# Patient Record
Sex: Female | Born: 1995 | Race: White | Hispanic: No | Marital: Single | Smoking: Never smoker
Health system: Southern US, Community
[De-identification: ages and names within clinical notes are randomized; demographics above are authoritative.]

## PROBLEM LIST (undated history)

## (undated) DIAGNOSIS — N200 Calculus of kidney: Secondary | ICD-10-CM

## (undated) DIAGNOSIS — E039 Hypothyroidism, unspecified: Secondary | ICD-10-CM

## (undated) HISTORY — PX: WISDOM TOOTH EXTRACTION: SHX21

## (undated) HISTORY — PX: MYRINGOTOMY WITH TUBE PLACEMENT: SHX5663

---

## 2014-06-03 ENCOUNTER — Encounter (HOSPITAL_COMMUNITY): Payer: Self-pay | Admitting: Emergency Medicine

## 2014-06-03 ENCOUNTER — Emergency Department (HOSPITAL_COMMUNITY): Payer: BC Managed Care – PPO

## 2014-06-03 ENCOUNTER — Emergency Department (HOSPITAL_COMMUNITY)
Admission: EM | Admit: 2014-06-03 | Discharge: 2014-06-04 | Disposition: A | Discharge status: Home / Self Care | Payer: BC Managed Care – PPO | Attending: Emergency Medicine | Admitting: Emergency Medicine

## 2014-06-03 DIAGNOSIS — R630 Anorexia: Secondary | ICD-10-CM | POA: Insufficient documentation

## 2014-06-03 DIAGNOSIS — R1031 Right lower quadrant pain: Secondary | ICD-10-CM | POA: Insufficient documentation

## 2014-06-03 DIAGNOSIS — Z3202 Encounter for pregnancy test, result negative: Secondary | ICD-10-CM | POA: Insufficient documentation

## 2014-06-03 DIAGNOSIS — R319 Hematuria, unspecified: Secondary | ICD-10-CM | POA: Insufficient documentation

## 2014-06-03 LAB — URINALYSIS, ROUTINE W REFLEX MICROSCOPIC
GLUCOSE, UA: NEGATIVE mg/dL
Hgb urine dipstick: NEGATIVE
Ketones, ur: 15 mg/dL — AB
LEUKOCYTES UA: NEGATIVE
NITRITE: NEGATIVE
PROTEIN: 30 mg/dL — AB
Specific Gravity, Urine: 1.029 (ref 1.005–1.030)
Urobilinogen, UA: 0.2 mg/dL (ref 0.0–1.0)
pH: 5 (ref 5.0–8.0)

## 2014-06-03 LAB — POC URINE PREG, ED: Preg Test, Ur: NEGATIVE

## 2014-06-03 LAB — COMPREHENSIVE METABOLIC PANEL
ALT: 13 U/L (ref 0–35)
AST: 15 U/L (ref 0–37)
Albumin: 3.9 g/dL (ref 3.5–5.2)
Alkaline Phosphatase: 46 U/L (ref 39–117)
Anion gap: 15 (ref 5–15)
BUN: 15 mg/dL (ref 6–23)
CALCIUM: 9.8 mg/dL (ref 8.4–10.5)
CO2: 22 mEq/L (ref 19–32)
Chloride: 98 mEq/L (ref 96–112)
Creatinine, Ser: 0.75 mg/dL (ref 0.50–1.10)
GFR calc Af Amer: >90 mL/min (ref >90)
GFR calc non Af Amer: >90 mL/min (ref >90)
GLUCOSE: 80 mg/dL (ref 70–99)
Potassium: 4.2 mEq/L (ref 3.7–5.3)
SODIUM: 135 meq/L — AB (ref 137–147)
Total Bilirubin: 0.9 mg/dL (ref 0.3–1.2)
Total Protein: 7.3 g/dL (ref 6.0–8.3)

## 2014-06-03 LAB — CBC WITH DIFFERENTIAL/PLATELET
BASOS ABS: 0 10*3/uL (ref 0.0–0.1)
Basophils Relative: 0 % (ref 0–1)
EOS PCT: 1 % (ref 0–5)
Eosinophils Absolute: 0 10*3/uL (ref 0.0–0.7)
HCT: 36.5 % (ref 36.0–46.0)
Hemoglobin: 12.6 g/dL (ref 12.0–15.0)
Lymphocytes Relative: 32 % (ref 12–46)
Lymphs Abs: 2.4 10*3/uL (ref 0.7–4.0)
MCH: 28.8 pg (ref 26.0–34.0)
MCHC: 34.5 g/dL (ref 30.0–36.0)
MCV: 83.3 fL (ref 78.0–100.0)
Monocytes Absolute: 0.4 10*3/uL (ref 0.1–1.0)
Monocytes Relative: 6 % (ref 3–12)
Neutro Abs: 4.5 10*3/uL (ref 1.7–7.7)
Neutrophils Relative %: 61 % (ref 43–77)
PLATELETS: 230 10*3/uL (ref 150–400)
RBC: 4.38 MIL/uL (ref 3.87–5.11)
RDW: 12.1 % (ref 11.5–15.5)
WBC: 7.4 10*3/uL (ref 4.0–10.5)

## 2014-06-03 LAB — WET PREP, GENITAL
TRICH WET PREP: NONE SEEN
YEAST WET PREP: NONE SEEN

## 2014-06-03 LAB — URINE MICROSCOPIC-ADD ON

## 2014-06-03 MED ORDER — IOHEXOL 300 MG/ML  SOLN
50.0000 mL | Freq: Once | INTRAMUSCULAR | Status: AC | PRN
  Administered 2014-06-03: 50 mL via ORAL

## 2014-06-03 MED ORDER — SODIUM CHLORIDE 0.9 % IV SOLN: 1000.0000 mL | INTRAVENOUS | Status: DC

## 2014-06-03 MED ORDER — SODIUM CHLORIDE 0.9 % IV SOLN
1000.0000 mL | Freq: Once | INTRAVENOUS | Status: AC
  Administered 2014-06-03: 1000 mL via INTRAVENOUS

## 2014-06-03 MED ORDER — MORPHINE SULFATE 4 MG/ML IJ SOLN
4.0000 mg | Freq: Once | INTRAMUSCULAR | Status: AC
  Administered 2014-06-03: 4 mg via INTRAVENOUS
  Filled 2014-06-03: qty 1

## 2014-06-03 MED ORDER — ONDANSETRON HCL 4 MG/2ML IJ SOLN
4.0000 mg | Freq: Once | INTRAMUSCULAR | Status: AC
  Administered 2014-06-03: 4 mg via INTRAVENOUS
  Filled 2014-06-03: qty 2

## 2014-06-03 MED ORDER — IOHEXOL 300 MG/ML  SOLN
100.0000 mL | Freq: Once | INTRAMUSCULAR | Status: AC | PRN
  Administered 2014-06-03: 100 mL via INTRAVENOUS

## 2014-06-03 NOTE — ED Notes (Addendum)
Pt c/o RLQ abd pain onset last night, decreased appetite, denies n/v/d. + rebound tenderness RLQ, worse with standing

## 2014-06-03 NOTE — ED Notes (Signed)
Pt reports RLQ pain and lightheadedness since last night with decreased appetite.  Pt reports pain initially was tolerable, gotten worse today while at work.   Pt reports she has not passed any gas.

## 2014-06-03 NOTE — ED Notes (Signed)
Pt drinking PO contrast at present.  Family at bedside

## 2014-06-03 NOTE — ED Provider Notes (Signed)
CSN: [id number]     Arrival date & time 06/03/14  1919 History   First MD Initiated Contact with Patient 06/03/14 2012     Chief Complaint  Patient presents with  . Abdominal Pain     (Consider location/radiation/quality/duration/timing/severity/associated sxs/prior Treatment) HPI Comments: Natalie Kramer is a(n) 18 y.o. female who presents w/ cc of RLQ pain. She states that she has had intermittent sharp pain in the RLQ for many months, however it woke her from sleep at 1:00 AM and has been progressively worsening since that time. It is constant, sharp, non-radiating. Denies fevers, chills, myalgias, arthralgias. Denies DOE, SOB, chest tightness or pressure, radiation to left arm, jaw or back, or diaphoresis. Denies dysuria, flank pain, suprapubic pain, frequency, urgency, or hematuria. Denies headaches, light headedness, weakness, visual disturbances. Deniesnausea, vomiting, diarrhea or constipation. Denies vaginal sxs. LMP 05/02/2014    Patient is a 18 y.o. female presenting with abdominal pain. The history is provided by the patient and a relative. No language interpreter was used.  Abdominal Pain Pain location:  RLQ Pain quality: aching, sharp and stabbing   Pain radiates to:  Does not radiate Pain severity:  Severe Onset quality:  Gradual Duration:  10 hours Timing:  Constant Progression:  Worsening Chronicity:  Recurrent Context: awakening from sleep   Context: not alcohol use, not diet changes, not eating, not laxative use, not medication withdrawal, not previous surgeries, not recent illness, not recent sexual activity, not recent travel, not retching, not sick contacts, not suspicious food intake and not trauma   Relieved by:  Nothing Worsened by:  Palpation, movement and position changes Ineffective treatments:  None tried Associated symptoms: anorexia and hematuria   Associated symptoms: no belching, no chest pain, no chills, no constipation, no cough, no diarrhea, no  dysuria, no fatigue, no fever, no flatus, no hematemesis, no hematochezia, no melena, no nausea, no shortness of breath, no sore throat, no vaginal bleeding, no vaginal discharge and no vomiting      History reviewed. No pertinent past medical history. Past Surgical History  Procedure Laterality Date  . Myringotomy with tube placement     No family history on file. History  Substance Use Topics  . Smoking status: Never Smoker   . Smokeless tobacco: Not on file  . Alcohol Use: No   OB History   Grav Para Term Preterm Abortions TAB SAB Ect Mult Living                 Review of Systems  Constitutional: Negative for fever, chills and fatigue.  HENT: Negative for sore throat.   Respiratory: Negative for cough and shortness of breath.   Cardiovascular: Negative for chest pain.  Gastrointestinal: Positive for abdominal pain and anorexia. Negative for nausea, vomiting, diarrhea, constipation, melena, hematochezia, flatus and hematemesis.  Genitourinary: Positive for hematuria. Negative for dysuria, vaginal bleeding and vaginal discharge.      Allergies  Review of patient's allergies indicates no known allergies.  Home Medications   Prior to Admission medications   Not on File   BP 127/73  Pulse 114  Temp(Src) 98.4 F (36.9 C) (Oral)  Resp 20  Ht 5\' 8"  (1.727 m)  Wt 157 lb (71.215 kg)  BMI 23.88 kg/m2  SpO2 100%  LMP 05/04/2014 Physical Exam  Nursing note and vitals reviewed. Constitutional: She is oriented to person, place, and time. She appears well-developed and well-nourished. No distress.  Flushed. Appears uncomfortable  HENT:  Head: Normocephalic and atraumatic.  Eyes: Conjunctivae  are normal. No scleral icterus.  Neck: Normal range of motion.  Cardiovascular: Normal rate, regular rhythm and normal heart sounds.  Exam reveals no gallop and no friction rub.   No murmur heard. Pulmonary/Chest: Effort normal and breath sounds normal. No respiratory distress.    Abdominal: Soft. Normal appearance and bowel sounds are normal. She exhibits no distension and no mass. There is tenderness in the right lower quadrant. There is rebound. There is no guarding and no CVA tenderness.    Neurological: She is alert and oriented to person, place, and time.  Skin: Skin is warm and dry. She is not diaphoretic.    ED Course  Procedures (including critical care time) Labs Review Labs Reviewed  WET PREP, GENITAL - Abnormal; Notable for the following:    Clue Cells Wet Prep HPF POC FEW (*)    WBC, Wet Prep HPF POC FEW (*)    All other components within normal limits  COMPREHENSIVE METABOLIC PANEL - Abnormal; Notable for the following:    Sodium 135 (*)    All other components within normal limits  URINALYSIS, ROUTINE W REFLEX MICROSCOPIC - Abnormal; Notable for the following:    Color, Urine AMBER (*)    APPearance CLOUDY (*)    Bilirubin Urine SMALL (*)    Ketones, ur 15 (*)    Protein, ur 30 (*)    All other components within normal limits  URINE MICROSCOPIC-ADD ON - Abnormal; Notable for the following:    Bacteria, UA FEW (*)    All other components within normal limits  GC/CHLAMYDIA PROBE AMP  CBC WITH DIFFERENTIAL  POC URINE PREG, ED    Imaging Review No results found.   EKG Interpretation None      MDM   Final diagnoses:  Abdominal pain, RLQ (right lower quadrant)   Patient with RLQ abdominal pain, tachycardic, +rebound, obturator, and psoas sign.  Will obtain a CT to r/o appenidcitis. Pain medication and antiemetics given.    Patient labs without acute abnormality.  Pelvic exam: normal external genitalia, vulva, vagina, cervix, uterus and adnexa No CMT, cervical discharge or adnexal tenderness, however patient stlll has RLQ abdominal pain. Will proceed to pelvic US to r/o ovarian torsion.  Patient with negative pelvic US no sign of torsion.  Wet prep with few clue cells, however the patient has no vaginal complaints.  I have  discussed all labs and imaging findings with the patient and her parents.  There does not appear to be an emergent cause of the patent's pain.  I will have the patient follow up with a pcp and OB gyn.   Patient is nontoxic, nonseptic appearing, in no apparent distress.  Patient's pain and other symptoms adequately managed in emergency department.  Fluid bolus given.  Labs, imaging and vitals reviewed.  Patient does not meet the SIRS or Sepsis criteria.  On repeat exam patient does not have a surgical abdomin and there are nor peritoneal signs.  No indication of appendicitis, bowel obstruction, bowel perforation, cholecystitis, diverticulitis, PID or ectopic pregnancy.  Patient discharged home with symptomatic treatment and given strict instructions for follow-up with their primary care physician.  I have also discussed reasons to return immediately to the ER.  Patient expresses understanding and agrees with plan.       Arthor Captain, PA-C 06/06/14 2050

## 2014-06-03 NOTE — ED Provider Notes (Signed)
Complains of right lower quadrant pain onset a few months ago however he came to be worse approximately 1 AM today. Pain has been constant nonradiating admits to diminished appetite no other associated symptoms. No treatment prior to coming here.. Patient feels much improved since treatment in the emergency department on exam no distress abdomen nondistended tender right lower quadrant no guarding no rigidity no rebound  Doug SouSam Haylen Bellotti, MD 06/03/14 2315

## 2014-06-04 MED ORDER — NAPROXEN 500 MG PO TABS: 500.0000 mg | ORAL_TABLET | Freq: Two times a day (BID) | ORAL | Status: DC

## 2014-06-04 NOTE — ED Notes (Signed)
Patient transported to Ultrasound.  US at bedside 

## 2014-06-04 NOTE — Discharge Instructions (Signed)
Abdominal (belly) pain can be caused by many things. Your caregiver performed an examination and possibly ordered blood/urine tests and imaging (CT scan, x-rays, ultrasound). Many cases can be observed and treated at home after initial evaluation in the emergency department. Even though you are being discharged home, abdominal pain can be unpredictable. Therefore, you need a repeated exam if your pain does not resolve, returns, or worsens. Most patients with abdominal pain don't have to be admitted to the hospital or have surgery, but serious problems like appendicitis and gallbladder attacks can start out as nonspecific pain. Many abdominal conditions cannot be diagnosed in one visit, so follow-up evaluations are very important. SEEK IMMEDIATE MEDICAL ATTENTION IF: The pain does not go away or becomes severe.  A temperature above 101 develops.  Repeated vomiting occurs (multiple episodes).  The pain becomes localized to portions of the abdomen. The right side could possibly be appendicitis. In an adult, the left lower portion of the abdomen could be colitis or diverticulitis.  Blood is being passed in stools or vomit (bright red or black tarry stools).  Return also if you develop chest pain, difficulty breathing, dizziness or fainting, or become confused, poorly responsive, or inconsolable (young children).   Abdominal Pain, Women Abdominal (stomach, pelvic, or belly) pain can be caused by many things. It is important to tell your doctor:  The location of the pain.  Does it come and go or is it present all the time?  Are there things that start the pain (eating certain foods, exercise)?  Are there other symptoms associated with the pain (fever, nausea, vomiting, diarrhea)? All of this is helpful to know when trying to find the cause of the pain. CAUSES   Stomach: virus or bacteria infection, or ulcer.  Intestine: appendicitis (inflamed appendix), regional ileitis (Crohn's disease),  ulcerative colitis (inflamed colon), irritable bowel syndrome, diverticulitis (inflamed diverticulum of the colon), or cancer of the stomach or intestine.  Gallbladder disease or stones in the gallbladder.  Kidney disease, kidney stones, or infection.  Pancreas infection or cancer.  Fibromyalgia (pain disorder).  Diseases of the female organs:  Uterus: fibroid (non-cancerous) tumors or infection.  Fallopian tubes: infection or tubal pregnancy.  Ovary: cysts or tumors.  Pelvic adhesions (scar tissue).  Endometriosis (uterus lining tissue growing in the pelvis and on the pelvic organs).  Pelvic congestion syndrome (female organs filling up with blood just before the menstrual period).  Pain with the menstrual period.  Pain with ovulation (producing an egg).  Pain with an IUD (intrauterine device, birth control) in the uterus.  Cancer of the female organs.  Functional pain (pain not caused by a disease, may improve without treatment).  Psychological pain.  Depression. DIAGNOSIS  Your doctor will decide the seriousness of your pain by doing an examination.  Blood tests.  X-rays.  Ultrasound.  CT scan (computed tomography, special type of X-ray).  MRI (magnetic resonance imaging).  Cultures, for infection.  Barium enema (dye inserted in the large intestine, to better view it with X-rays).  Colonoscopy (looking in intestine with a lighted tube).  Laparoscopy (minor surgery, looking in abdomen with a lighted tube).  Major abdominal exploratory surgery (looking in abdomen with a large incision). TREATMENT  The treatment will depend on the cause of the pain.   Many cases can be observed and treated at home.  Over-the-counter medicines recommended by your caregiver.  Prescription medicine.  Antibiotics, for infection.  Birth control pills, for painful periods or for ovulation pain.  Hormone treatment, for endometriosis.  Nerve blocking  injections.  Physical therapy.  Antidepressants.  Counseling with a psychologist or psychiatrist.  Minor or major surgery. HOME CARE INSTRUCTIONS   Do not take laxatives, unless directed by your caregiver.  Take over-the-counter pain medicine only if ordered by your caregiver. Do not take aspirin because it can cause an upset stomach or bleeding.  Try a clear liquid diet (broth or water) as ordered by your caregiver. Slowly move to a bland diet, as tolerated, if the pain is related to the stomach or intestine.  Have a thermometer and take your temperature several times a day, and record it.  Bed rest and sleep, if it helps the pain.  Avoid sexual intercourse, if it causes pain.  Avoid stressful situations.  Keep your follow-up appointments and tests, as your caregiver orders.  If the pain does not go away with medicine or surgery, you may try:  Acupuncture.  Relaxation exercises (yoga, meditation).  Group therapy.  Counseling. SEEK MEDICAL CARE IF:   You notice certain foods cause stomach pain.  Your home care treatment is not helping your pain.  You need stronger pain medicine.  You want your IUD removed.  You feel faint or lightheaded.  You develop nausea and vomiting.  You develop a rash.  You are having side effects or an allergy to your medicine. SEEK IMMEDIATE MEDICAL CARE IF:   Your pain does not go away or gets worse.  You have a fever.  Your pain is felt only in portions of the abdomen. The right side could possibly be appendicitis. The left lower portion of the abdomen could be colitis or diverticulitis.  You are passing blood in your stools (bright red or black tarry stools, with or without vomiting).  You have blood in your urine.  You develop chills, with or without a fever.  You pass out. MAKE SURE YOU:   Understand these instructions.  Will watch your condition.  Will get help right away if you are not doing well or get  worse. Document Released: 09/06/2007 Document Revised: 02/01/2012 Document Reviewed: 09/26/2009 [facility] Patient Information 2015 [employer], [place]. This information is not intended to replace advice given to you by your health care provider. Make sure you discuss any questions you have with your health care provider.

## 2014-06-05 LAB — GC/CHLAMYDIA PROBE AMP
CT Probe RNA: NEGATIVE
GC Probe RNA: NEGATIVE

## 2014-06-10 NOTE — ED Provider Notes (Signed)
Medical screening examination/treatment/procedure(s) were conducted as a shared visit with non-physician practitioner(s) and myself.  I personally evaluated the patient during the encounter.   EKG Interpretation None       Doug SouSam Darral Rishel, MD 06/10/14 2055

## 2015-04-21 IMAGING — CT CT ABD-PELV W/ CM
1 of 2 series · 15 of 32 positions shown, 19 images · IV contrast (OMNIPAQUE 300)
Comparison: None.

CLINICAL DATA: Right lower quadrant pain, rebound tenderness.

EXAM:
CT ABDOMEN AND PELVIS WITH CONTRAST
TECHNIQUE: Multidetector CT imaging of the abdomen and pelvis was performed
using the standard protocol following bolus administration of
intravenous contrast.
CONTRAST:  50mL OMNIPAQUE IOHEXOL 300 MG/ML SOLN, 100mL OMNIPAQUE
IOHEXOL 300 MG/ML SOLN

[Series 2: abd/pel with · axial · 0.76mm/px · z∈[+1112,+1572]mm · 15 of 100 slices shown, 19 images]
[im 4/100  soft-tissue]
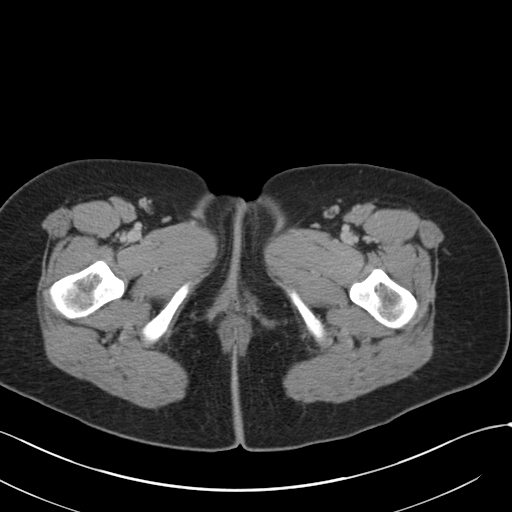
[im 4/100  bone]
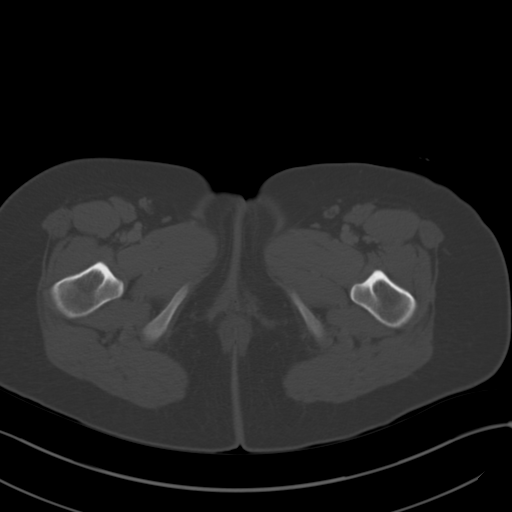
[im 12/100  soft-tissue]
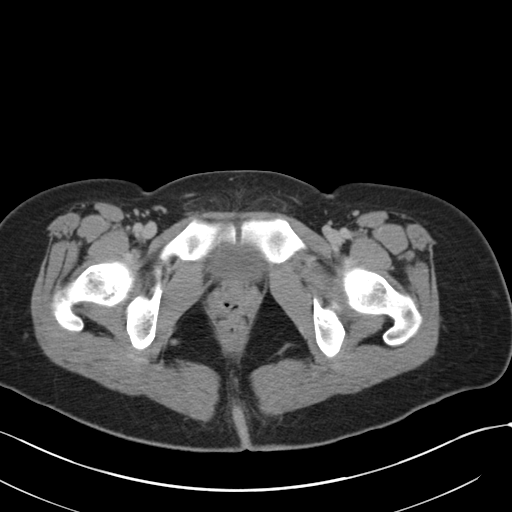
[im 20/100  soft-tissue]
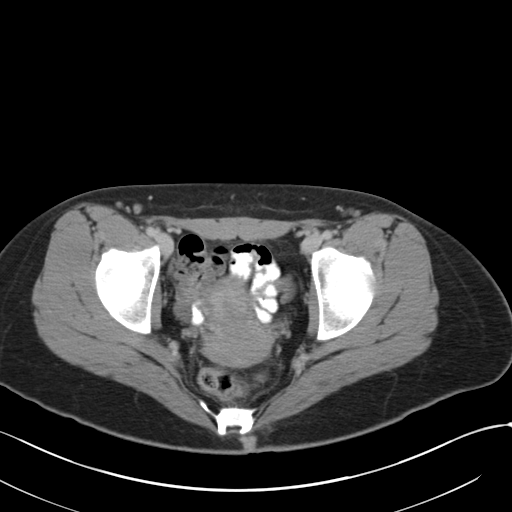
[im 28/100  soft-tissue]
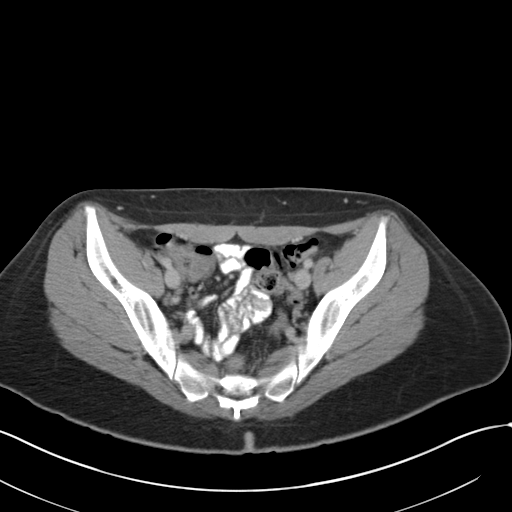
[im 36/100  soft-tissue]
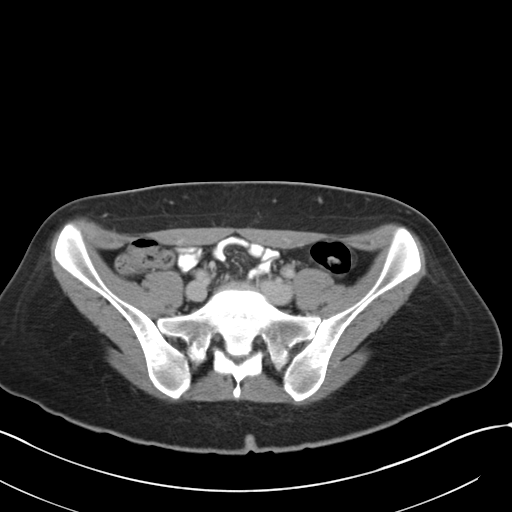
[im 44/100  soft-tissue]
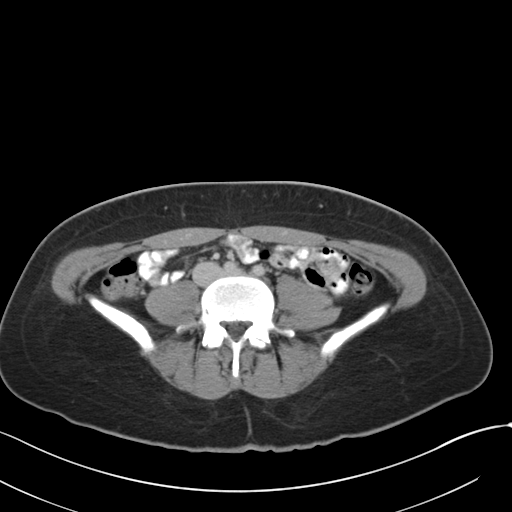
[im 52/100  soft-tissue]
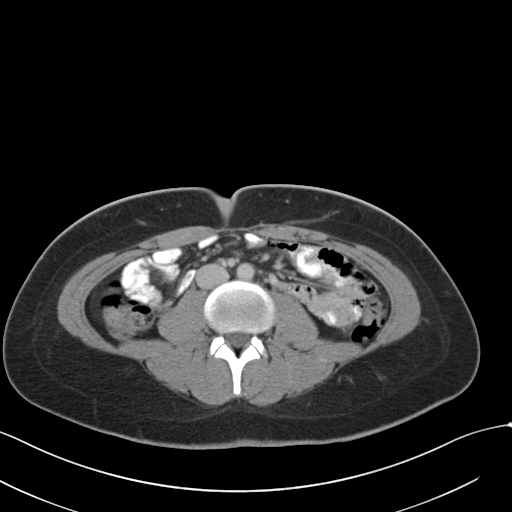
[im 56/100  soft-tissue]
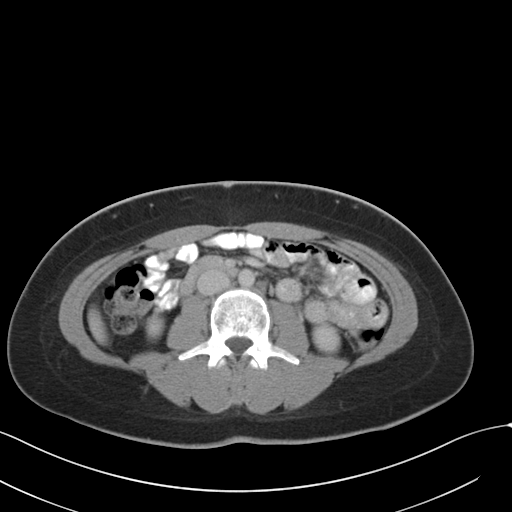
[im 64/100  soft-tissue]
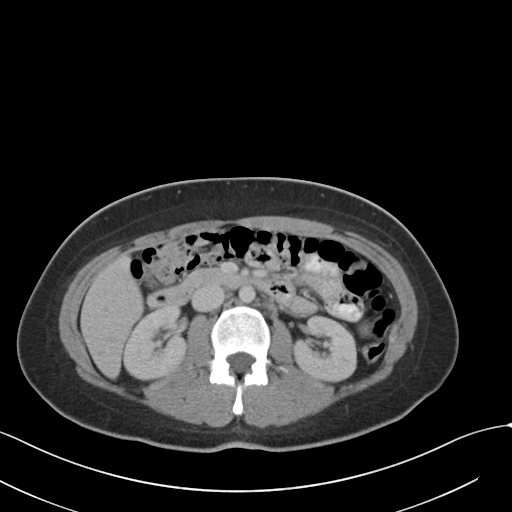
[im 64/100  bone]
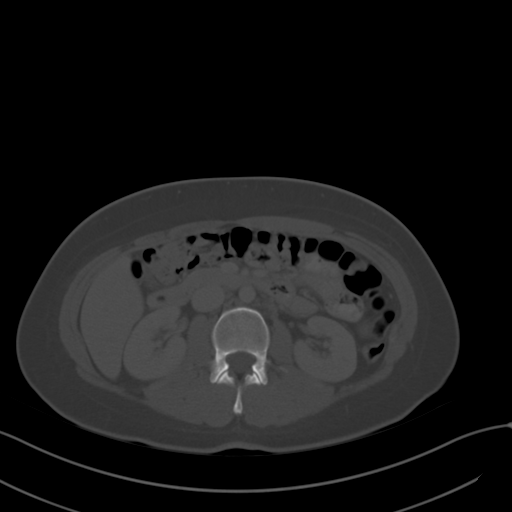
[im 72/100  soft-tissue]
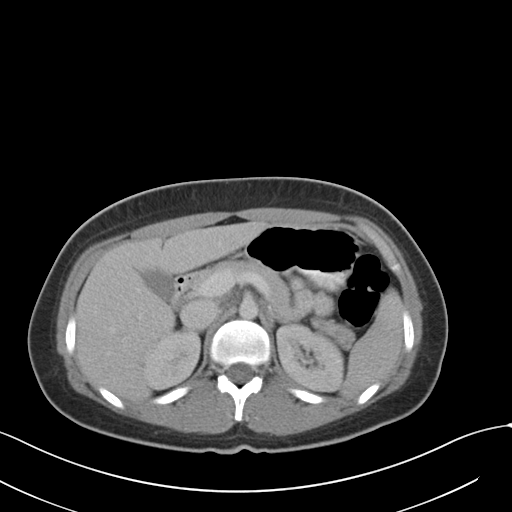
[im 80/100  soft-tissue]
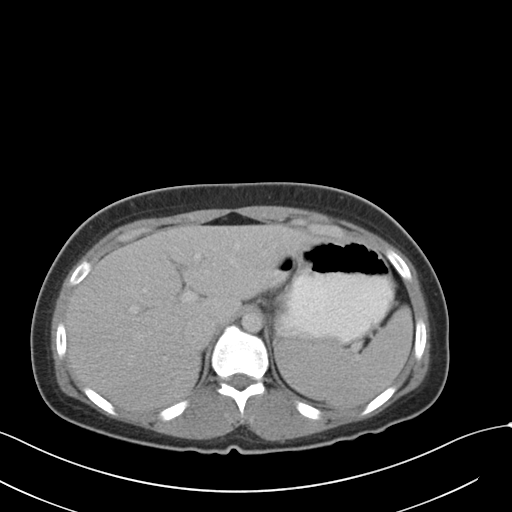
[im 84/100  lung]
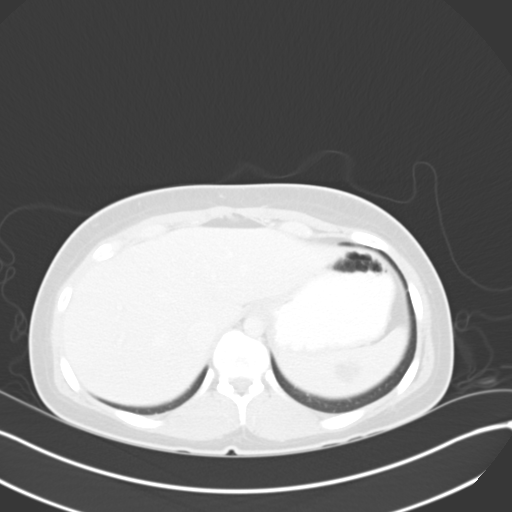
[im 88/100  soft-tissue]
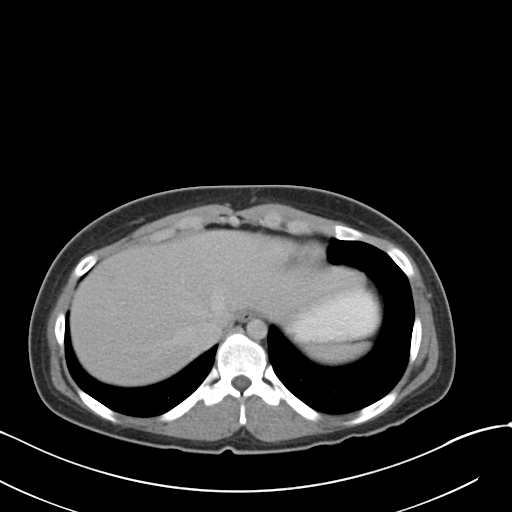
[im 88/100  lung]
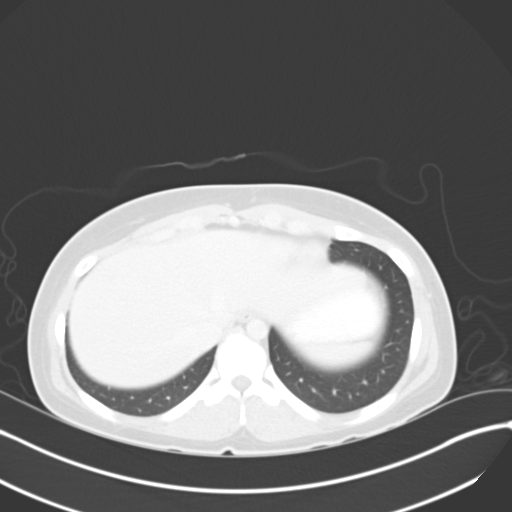
[im 92/100  lung]
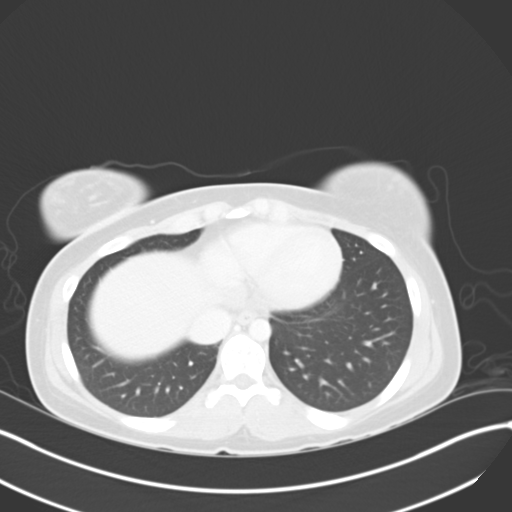
[im 96/100  soft-tissue]
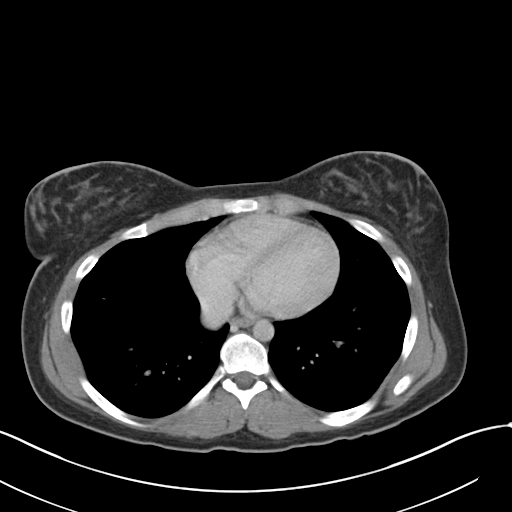
[im 96/100  lung]
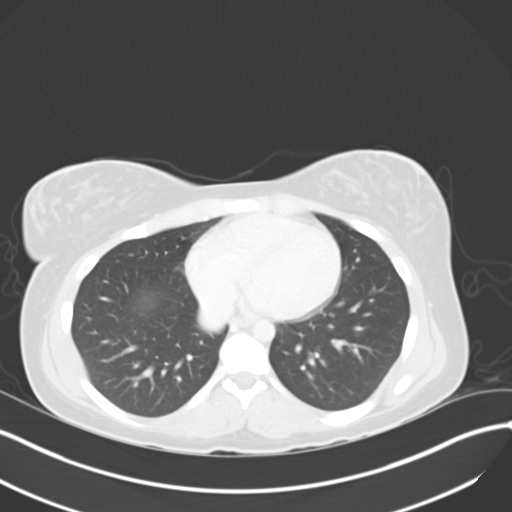

[15 of 32 positions shown; findings below may reference images not displayed]

FINDINGS: LUNG BASES: Included view of the lung bases are clear. Visualized
heart and pericardium are unremarkable.

SOLID ORGANS: The liver, gallbladder, pancreas and adrenal glands
are unremarkable. 2.3 cm cyst versus lymphangioma in the spleen, the
spleen is otherwise unremarkable.

GASTROINTESTINAL TRACT: The stomach, small and large bowel are
normal in course and caliber without inflammatory changes. Enteric
contrast has not yet reached the distal small bowel. Normal
appendix.

KIDNEYS/ URINARY TRACT: Kidneys are orthotopic, demonstrating
symmetric enhancement. 4 mm right upper pole, 3 mm left upper pole
nonobstructing nephrolithiasis. No Hydronephrosis or renal masses.
The unopacified ureters are normal in course and caliber. Urinary
bladder is partially distended and unremarkable.

PERITONEUM/RETROPERITONEUM: No intraperitoneal free fluid nor free
air. Aortoiliac vessels are normal in course and caliber. No
lymphadenopathy by CT size criteria. Internal reproductive organs
are unremarkable.

SOFT TISSUE/OSSEOUS STRUCTURES: Nonsuspicious.
IMPRESSION: Bilateral nonobstructing nephrolithiasis measuring up to 4 mm in
upper pole right kidney.

No acute intra-abdominal or pelvic process; normal appendix.

  By: Achilles Noyola

## 2015-04-22 IMAGING — US US TRANSVAGINAL NON-OB
1 series · 13 of 25 positions shown · non-contrast
Comparison: CT of the abdomen and pelvis June 03, 2014.

CLINICAL DATA: Rule out ovarian torsion.

EXAM:
TRANSABDOMINAL AND TRANSVAGINAL ULTRASOUND OF PELVIS
DOPPLER ULTRASOUND OF OVARIES
TECHNIQUE: Both transabdominal and transvaginal ultrasound examinations of the
pelvis were performed. Transabdominal technique was performed for
global imaging of the pelvis including uterus, ovaries, adnexal
regions, and pelvic cul-de-sac.
It was necessary to proceed with endovaginal exam following the
transabdominal exam to visualize the ovaries. Color and duplex
Doppler ultrasound was utilized to evaluate blood flow to the
ovaries.

[Series 1: us transvaginal non-ob · 0.18mm/px · 13 of 42 slices shown]
[im 1/42]
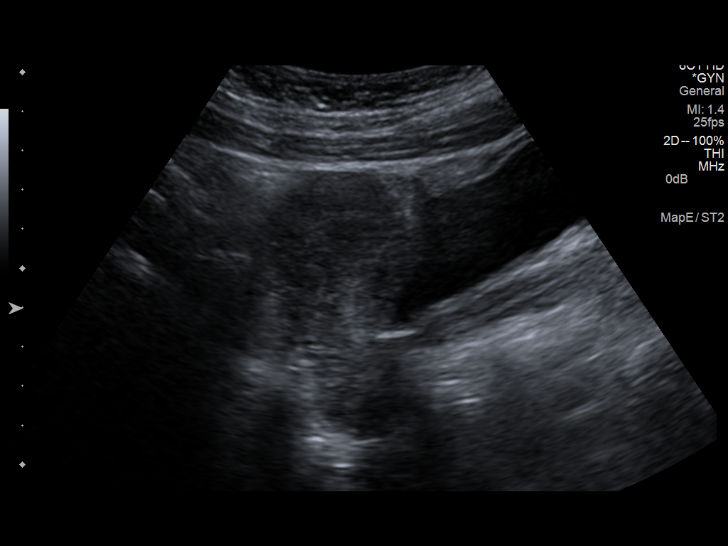
[im 4/42]
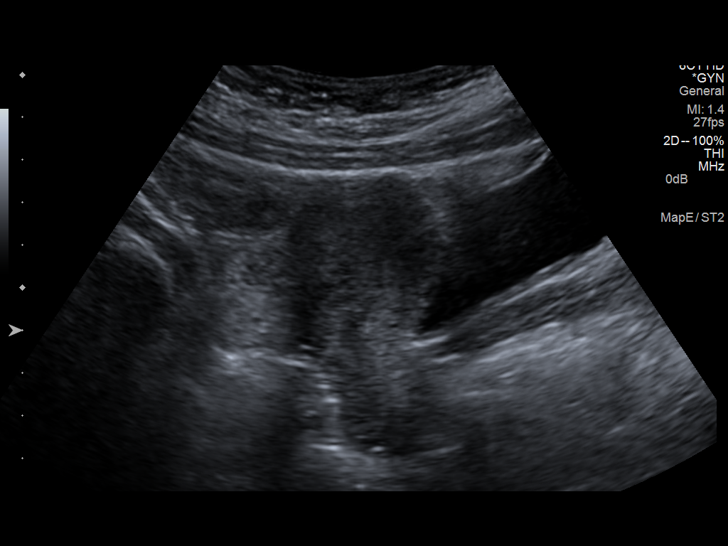
[im 7/42]
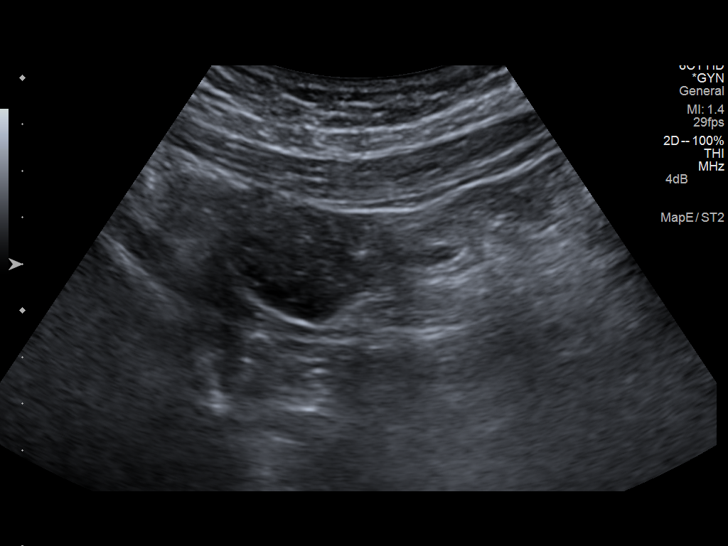
[im 11/42]
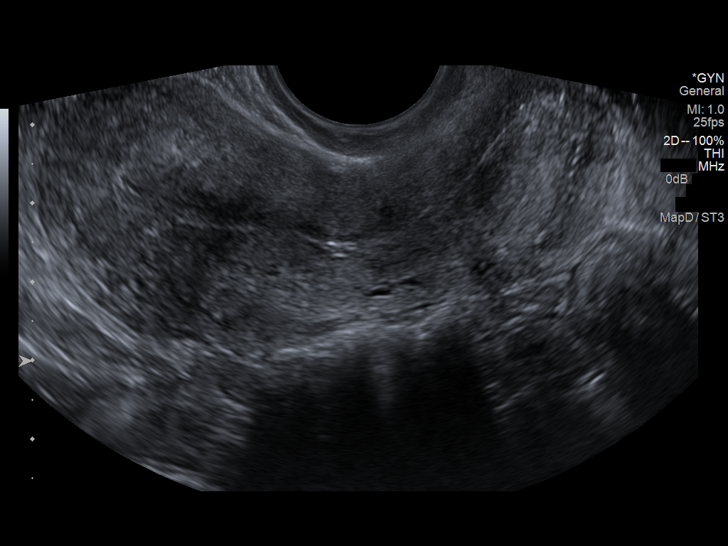
[im 14/42]
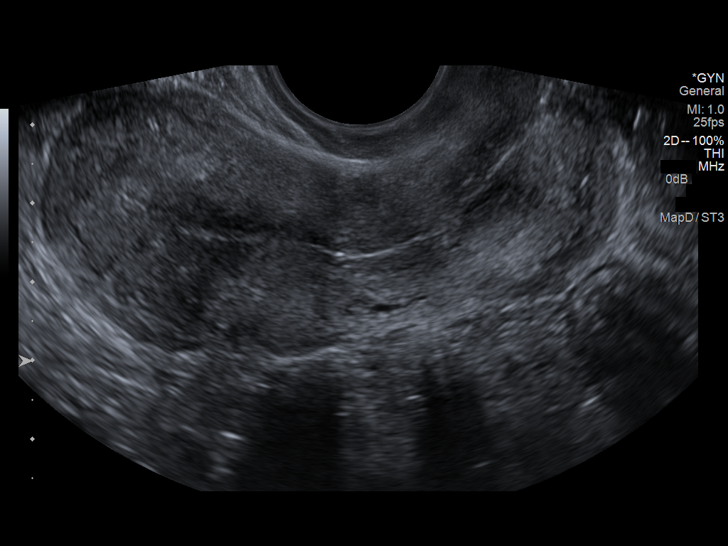
[im 18/42]
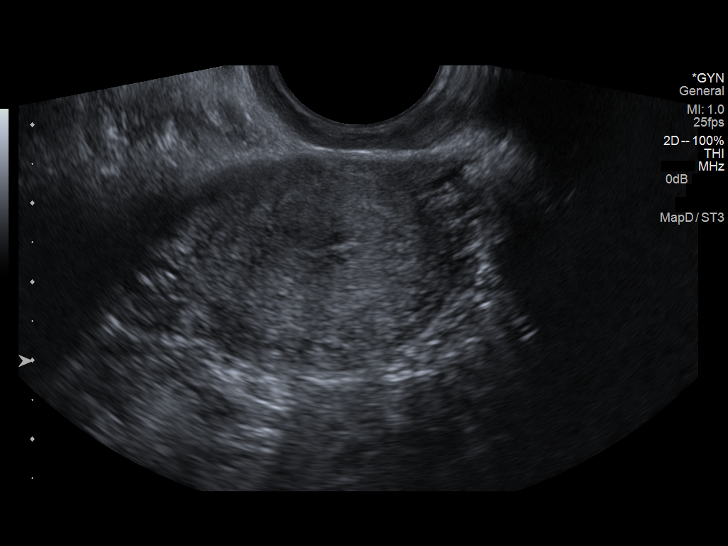
[im 21/42]
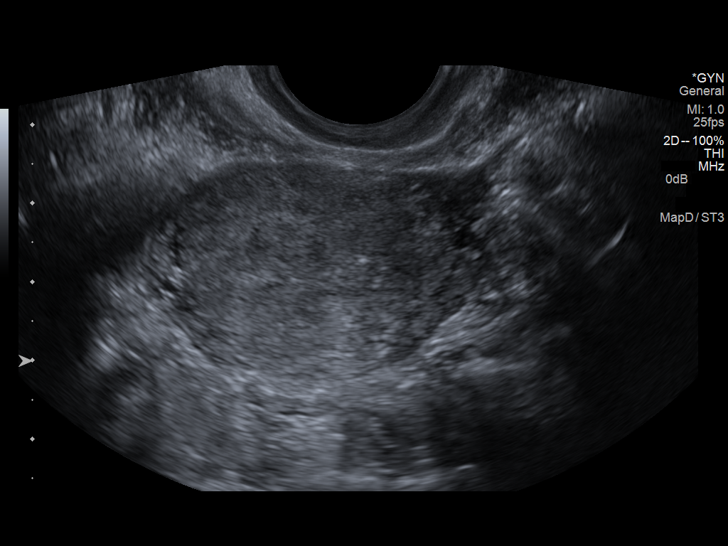
[im 24/42]
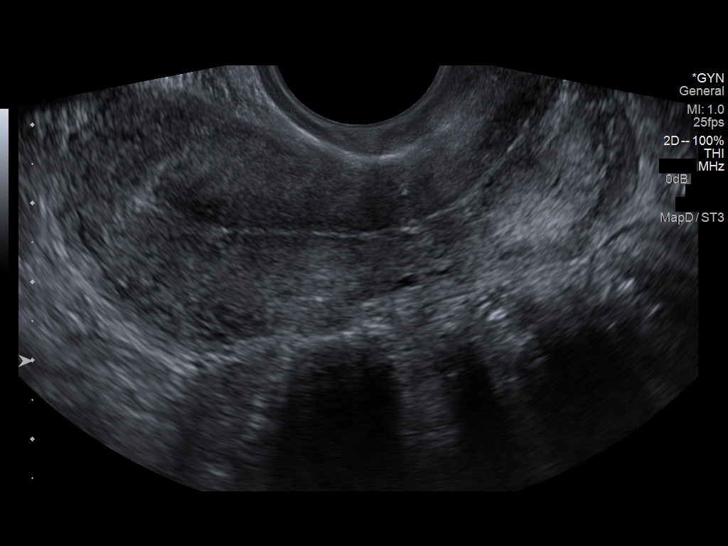
[im 28/42]
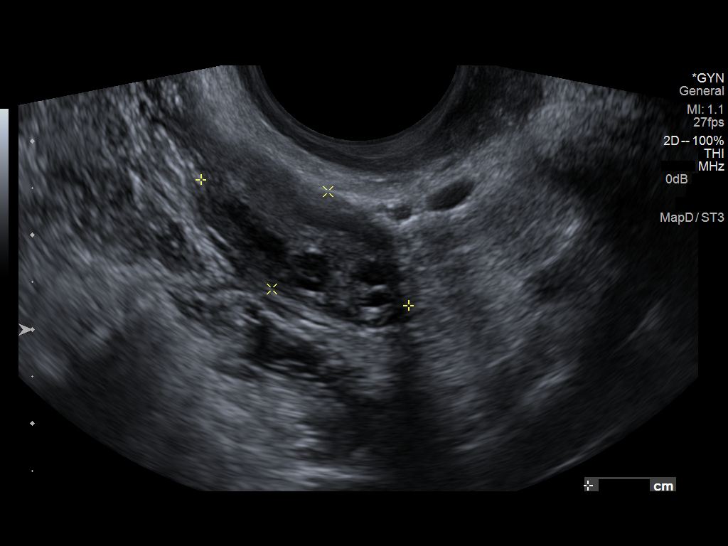
[im 31/42]
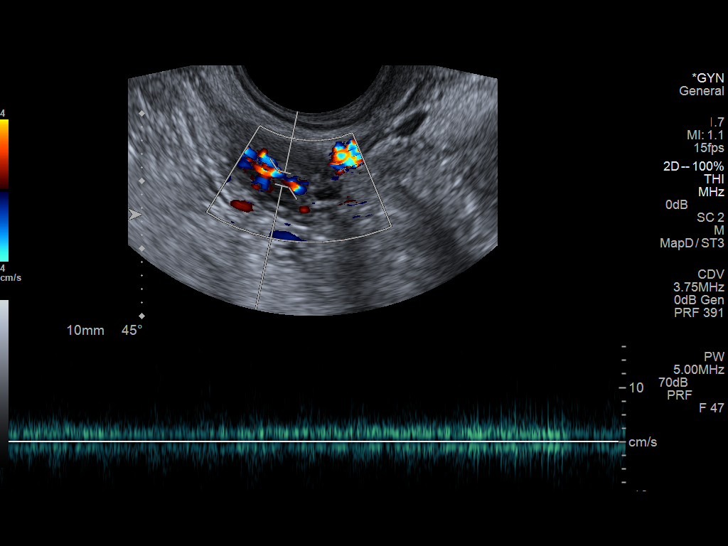
[im 35/42]
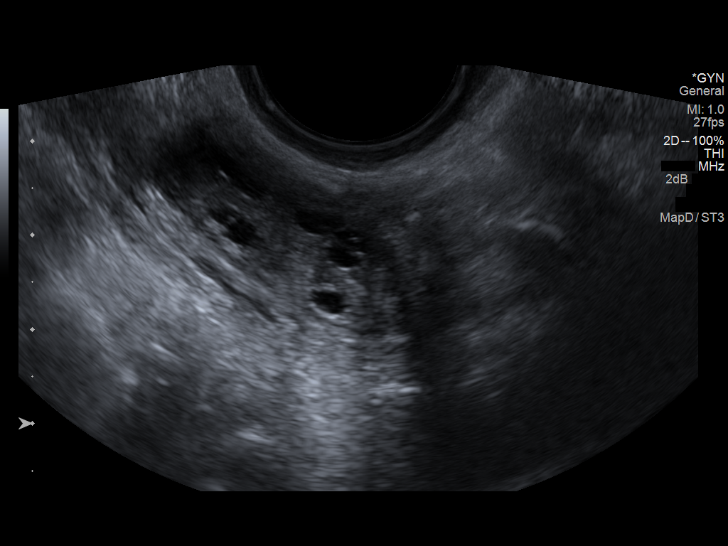
[im 38/42]
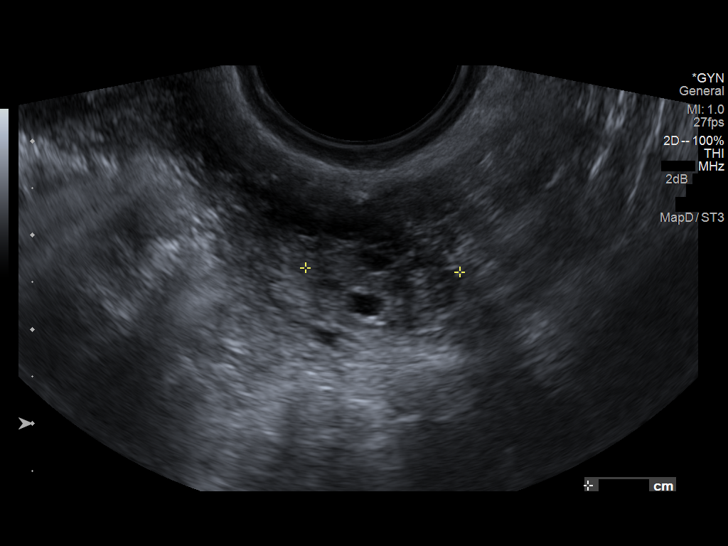
[im 42/42]
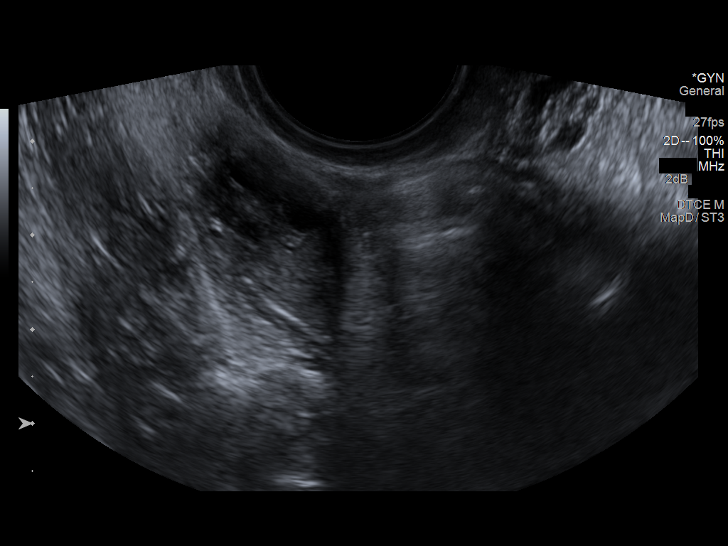

[13 of 25 positions shown; findings below may reference images not displayed]

FINDINGS: Uterus

Measurements: 7.2 x 3.4 x 3.9 cm. No fibroids or other mass
visualized.

Endometrium

Thickness: 2 mm.  No focal abnormality visualized.

Right ovary

Measurements: 2.6 x 1.2 x 1.3 cm. Normal appearance/no adnexal mass.

Left ovary

Measurements: 3 x 1.1 x 1.6 cm. Normal appearance/no adnexal mass.

Pulsed Doppler evaluation of both ovaries demonstrates normal
low-resistance arterial and venous waveforms.

Other findings

No free fluid.
IMPRESSION: Normal pelvic sonogram, specifically no sonographic findings of
ovarian torsion.

  By: Ohbertau Dajeancon

## 2019-11-29 DIAGNOSIS — F900 Attention-deficit hyperactivity disorder, predominantly inattentive type: Secondary | ICD-10-CM | POA: Diagnosis not present

## 2019-11-29 DIAGNOSIS — E039 Hypothyroidism, unspecified: Secondary | ICD-10-CM | POA: Diagnosis not present

## 2019-11-29 DIAGNOSIS — Z6828 Body mass index (BMI) 28.0-28.9, adult: Secondary | ICD-10-CM | POA: Diagnosis not present

## 2020-01-10 ENCOUNTER — Other Ambulatory Visit (HOSPITAL_COMMUNITY): Payer: Self-pay | Admitting: Obstetrics and Gynecology

## 2020-01-10 DIAGNOSIS — Z01419 Encounter for gynecological examination (general) (routine) without abnormal findings: Secondary | ICD-10-CM | POA: Diagnosis not present

## 2020-01-10 DIAGNOSIS — Z6827 Body mass index (BMI) 27.0-27.9, adult: Secondary | ICD-10-CM | POA: Diagnosis not present

## 2021-01-29 ENCOUNTER — Other Ambulatory Visit (HOSPITAL_COMMUNITY): Payer: Self-pay | Admitting: Obstetrics and Gynecology

## 2021-02-06 ENCOUNTER — Other Ambulatory Visit (HOSPITAL_COMMUNITY): Payer: Self-pay | Admitting: Obstetrics and Gynecology

## 2021-02-07 ENCOUNTER — Other Ambulatory Visit (HOSPITAL_COMMUNITY): Payer: Self-pay | Admitting: Nurse Practitioner

## 2021-03-17 ENCOUNTER — Other Ambulatory Visit (HOSPITAL_COMMUNITY): Payer: Self-pay

## 2021-03-17 MED FILL — Thyroid Tab 60 MG (1 Grain): ORAL | 30 days supply | Qty: 30 | Fill #0 | Status: AC

## 2021-03-17 MED FILL — Drospirenone-Ethinyl Estrad-Levomefolate Tab 3-0.03-0.451 MG: ORAL | 28 days supply | Qty: 28 | Fill #0 | Status: AC

## 2021-03-18 ENCOUNTER — Other Ambulatory Visit (HOSPITAL_COMMUNITY): Payer: Self-pay

## 2021-04-14 ENCOUNTER — Other Ambulatory Visit (HOSPITAL_COMMUNITY): Payer: Self-pay | Admitting: Obstetrics and Gynecology

## 2021-04-14 ENCOUNTER — Other Ambulatory Visit (HOSPITAL_COMMUNITY): Payer: Self-pay

## 2021-04-14 MED ORDER — DROSPIREN-ETH ESTRAD-LEVOMEFOL 3-0.02-0.451 MG PO TABS
ORAL_TABLET | ORAL | 1 refills | Status: DC
  Filled 2021-04-14: qty 28, 28d supply, fill #0

## 2021-04-14 MED FILL — Thyroid Tab 60 MG (1 Grain): ORAL | 30 days supply | Qty: 30 | Fill #1 | Status: AC

## 2021-04-15 ENCOUNTER — Other Ambulatory Visit (HOSPITAL_COMMUNITY): Payer: Self-pay

## 2021-04-23 ENCOUNTER — Other Ambulatory Visit (HOSPITAL_COMMUNITY): Payer: Self-pay

## 2021-04-23 MED ORDER — DROSPIREN-ETH ESTRAD-LEVOMEFOL 3-0.02-0.451 MG PO TABS
ORAL_TABLET | ORAL | 4 refills | Status: DC
  Filled 2021-04-23: qty 28, 28d supply, fill #0
  Filled 2021-06-08: qty 28, 28d supply, fill #1
  Filled 2021-07-08: qty 28, 28d supply, fill #2
  Filled 2021-08-11: qty 84, 84d supply, fill #3

## 2021-05-05 ENCOUNTER — Other Ambulatory Visit (HOSPITAL_COMMUNITY): Payer: Self-pay

## 2021-05-12 ENCOUNTER — Other Ambulatory Visit (HOSPITAL_COMMUNITY): Payer: Self-pay

## 2021-05-13 ENCOUNTER — Other Ambulatory Visit (HOSPITAL_COMMUNITY): Payer: Self-pay

## 2021-05-13 MED ORDER — THYROID 60 MG PO TABS
ORAL_TABLET | ORAL | 2 refills | Status: DC
  Filled 2021-05-13 – 2021-05-22 (×2): qty 30, 30d supply, fill #0
  Filled 2021-06-08: qty 30, 30d supply, fill #1
  Filled 2021-07-19: qty 30, 30d supply, fill #2

## 2021-05-21 ENCOUNTER — Other Ambulatory Visit (HOSPITAL_COMMUNITY): Payer: Self-pay

## 2021-05-22 ENCOUNTER — Other Ambulatory Visit (HOSPITAL_COMMUNITY): Payer: Self-pay

## 2021-06-09 ENCOUNTER — Other Ambulatory Visit (HOSPITAL_COMMUNITY): Payer: Self-pay

## 2021-06-10 ENCOUNTER — Other Ambulatory Visit (HOSPITAL_COMMUNITY): Payer: Self-pay

## 2021-06-14 ENCOUNTER — Other Ambulatory Visit (HOSPITAL_COMMUNITY): Payer: Self-pay

## 2021-07-08 ENCOUNTER — Other Ambulatory Visit (HOSPITAL_COMMUNITY): Payer: Self-pay

## 2021-07-09 ENCOUNTER — Other Ambulatory Visit (HOSPITAL_COMMUNITY): Payer: Self-pay

## 2021-07-19 ENCOUNTER — Other Ambulatory Visit (HOSPITAL_COMMUNITY): Payer: Self-pay

## 2021-08-11 ENCOUNTER — Other Ambulatory Visit (HOSPITAL_COMMUNITY): Payer: Self-pay

## 2021-08-12 ENCOUNTER — Other Ambulatory Visit (HOSPITAL_COMMUNITY): Payer: Self-pay

## 2021-08-12 MED ORDER — THYROID 60 MG PO TABS
ORAL_TABLET | ORAL | 2 refills | Status: DC
  Filled 2021-08-12: qty 30, 30d supply, fill #0
  Filled 2021-09-18 – 2021-10-15 (×2): qty 30, 30d supply, fill #1
  Filled 2021-12-21: qty 30, 30d supply, fill #2

## 2021-08-13 ENCOUNTER — Other Ambulatory Visit (HOSPITAL_COMMUNITY): Payer: Self-pay

## 2021-09-18 ENCOUNTER — Other Ambulatory Visit (HOSPITAL_COMMUNITY): Payer: Self-pay

## 2021-09-29 ENCOUNTER — Other Ambulatory Visit (HOSPITAL_COMMUNITY): Payer: Self-pay

## 2021-10-15 ENCOUNTER — Other Ambulatory Visit (HOSPITAL_COMMUNITY): Payer: Self-pay

## 2021-11-07 ENCOUNTER — Other Ambulatory Visit (HOSPITAL_COMMUNITY): Payer: Self-pay

## 2021-12-21 ENCOUNTER — Other Ambulatory Visit (HOSPITAL_COMMUNITY): Payer: Self-pay

## 2021-12-21 MED FILL — Terconazole Vaginal Cream 0.8%: VAGINAL | 3 days supply | Qty: 20 | Fill #0 | Status: AC

## 2021-12-22 ENCOUNTER — Other Ambulatory Visit (HOSPITAL_COMMUNITY): Payer: Self-pay

## 2021-12-23 ENCOUNTER — Other Ambulatory Visit (HOSPITAL_COMMUNITY): Payer: Self-pay

## 2021-12-23 MED ORDER — DROSPIREN-ETH ESTRAD-LEVOMEFOL 3-0.02-0.451 MG PO TABS
ORAL_TABLET | ORAL | 6 refills | Status: DC
  Filled 2021-12-23 – 2022-01-22 (×2): qty 28, 28d supply, fill #0

## 2021-12-31 ENCOUNTER — Other Ambulatory Visit (HOSPITAL_COMMUNITY): Payer: Self-pay

## 2022-01-20 ENCOUNTER — Other Ambulatory Visit (HOSPITAL_COMMUNITY): Payer: Self-pay

## 2022-01-22 ENCOUNTER — Other Ambulatory Visit (HOSPITAL_COMMUNITY): Payer: Self-pay

## 2022-01-22 MED ORDER — VYVANSE 40 MG PO CAPS
ORAL_CAPSULE | ORAL | 0 refills | Status: DC
  Filled 2022-01-22 (×2): qty 30, 30d supply, fill #0

## 2022-01-22 MED ORDER — ARMOUR THYROID 60 MG PO TABS
ORAL_TABLET | ORAL | 6 refills | Status: DC
  Filled 2022-01-22: qty 30, 30d supply, fill #0
  Filled 2022-02-21: qty 30, 30d supply, fill #1
  Filled 2022-03-22: qty 30, 30d supply, fill #2
  Filled 2022-04-06: qty 30, 30d supply, fill #3
  Filled 2022-05-19: qty 30, 30d supply, fill #4
  Filled 2022-06-19: qty 30, 30d supply, fill #5
  Filled 2022-07-19: qty 30, 30d supply, fill #6

## 2022-01-23 ENCOUNTER — Other Ambulatory Visit (HOSPITAL_COMMUNITY): Payer: Self-pay

## 2022-01-26 ENCOUNTER — Other Ambulatory Visit (HOSPITAL_COMMUNITY): Payer: Self-pay

## 2022-01-27 ENCOUNTER — Other Ambulatory Visit (HOSPITAL_COMMUNITY): Payer: Self-pay

## 2022-01-28 ENCOUNTER — Other Ambulatory Visit (HOSPITAL_COMMUNITY): Payer: Self-pay

## 2022-02-03 ENCOUNTER — Other Ambulatory Visit: Payer: Self-pay

## 2022-02-05 ENCOUNTER — Other Ambulatory Visit (HOSPITAL_COMMUNITY): Payer: Self-pay

## 2022-02-05 MED ORDER — AMPHETAMINE-DEXTROAMPHET ER 15 MG PO CP24
15.0000 mg | ORAL_CAPSULE | Freq: Every morning | ORAL | 0 refills | Status: DC
  Filled 2022-02-05: qty 30, 30d supply, fill #0

## 2022-02-21 ENCOUNTER — Other Ambulatory Visit (HOSPITAL_COMMUNITY): Payer: Self-pay

## 2022-02-23 ENCOUNTER — Other Ambulatory Visit (HOSPITAL_COMMUNITY): Payer: Self-pay

## 2022-03-04 ENCOUNTER — Other Ambulatory Visit (HOSPITAL_COMMUNITY): Payer: Self-pay

## 2022-03-05 ENCOUNTER — Other Ambulatory Visit (HOSPITAL_COMMUNITY): Payer: Self-pay

## 2022-03-05 MED ORDER — AMPHETAMINE-DEXTROAMPHET ER 20 MG PO CP24
ORAL_CAPSULE | ORAL | 0 refills | Status: DC
  Filled 2022-03-05: qty 30, 30d supply, fill #0

## 2022-03-07 ENCOUNTER — Other Ambulatory Visit (HOSPITAL_COMMUNITY): Payer: Self-pay

## 2022-03-12 ENCOUNTER — Other Ambulatory Visit (HOSPITAL_COMMUNITY): Payer: Self-pay

## 2022-03-18 ENCOUNTER — Other Ambulatory Visit (HOSPITAL_COMMUNITY): Payer: Self-pay

## 2022-03-23 ENCOUNTER — Other Ambulatory Visit (HOSPITAL_COMMUNITY): Payer: Self-pay

## 2022-04-06 ENCOUNTER — Other Ambulatory Visit (HOSPITAL_COMMUNITY): Payer: Self-pay

## 2022-04-06 MED ORDER — AMPHETAMINE-DEXTROAMPHET ER 20 MG PO CP24
20.0000 mg | ORAL_CAPSULE | Freq: Every day | ORAL | 0 refills | Status: DC
  Filled 2022-04-06: qty 30, 30d supply, fill #0

## 2022-04-15 ENCOUNTER — Other Ambulatory Visit (HOSPITAL_COMMUNITY): Payer: Self-pay

## 2022-04-22 ENCOUNTER — Other Ambulatory Visit: Payer: Self-pay

## 2022-04-22 ENCOUNTER — Encounter (HOSPITAL_COMMUNITY): Payer: Self-pay | Admitting: Emergency Medicine

## 2022-04-22 ENCOUNTER — Emergency Department (HOSPITAL_COMMUNITY)
Admission: EM | Admit: 2022-04-22 | Discharge: 2022-04-22 | Disposition: A | Discharge status: Home / Self Care | Payer: BC Managed Care – PPO | Attending: Emergency Medicine | Admitting: Emergency Medicine

## 2022-04-22 ENCOUNTER — Emergency Department (HOSPITAL_COMMUNITY): Payer: BC Managed Care – PPO

## 2022-04-22 DIAGNOSIS — R748 Abnormal levels of other serum enzymes: Secondary | ICD-10-CM | POA: Insufficient documentation

## 2022-04-22 DIAGNOSIS — Z3201 Encounter for pregnancy test, result positive: Secondary | ICD-10-CM | POA: Diagnosis not present

## 2022-04-22 DIAGNOSIS — E349 Endocrine disorder, unspecified: Secondary | ICD-10-CM

## 2022-04-22 DIAGNOSIS — R109 Unspecified abdominal pain: Secondary | ICD-10-CM

## 2022-04-22 LAB — CBC WITH DIFFERENTIAL/PLATELET
Abs Immature Granulocytes: 0.03 10*3/uL (ref 0.00–0.07)
Basophils Absolute: 0 10*3/uL (ref 0.0–0.1)
Basophils Relative: 0 %
Eosinophils Absolute: 0.1 10*3/uL (ref 0.0–0.5)
Eosinophils Relative: 1 %
HCT: 36.3 % (ref 36.0–46.0)
Hemoglobin: 12.3 g/dL (ref 12.0–15.0)
Immature Granulocytes: 0 %
Lymphocytes Relative: 17 %
Lymphs Abs: 1.5 10*3/uL (ref 0.7–4.0)
MCH: 31 pg (ref 26.0–34.0)
MCHC: 33.9 g/dL (ref 30.0–36.0)
MCV: 91.4 fL (ref 80.0–100.0)
Monocytes Absolute: 0.6 10*3/uL (ref 0.1–1.0)
Monocytes Relative: 7 %
Neutro Abs: 6.7 10*3/uL (ref 1.7–7.7)
Neutrophils Relative %: 75 %
Platelets: 226 10*3/uL (ref 150–400)
RBC: 3.97 MIL/uL (ref 3.87–5.11)
RDW: 12.9 % (ref 11.5–15.5)
WBC: 9 10*3/uL (ref 4.0–10.5)
nRBC: 0 % (ref 0.0–0.2)

## 2022-04-22 LAB — URINALYSIS, ROUTINE W REFLEX MICROSCOPIC
Bilirubin Urine: NEGATIVE
Glucose, UA: NEGATIVE mg/dL
Hgb urine dipstick: NEGATIVE
Ketones, ur: NEGATIVE mg/dL
Leukocytes,Ua: NEGATIVE
Nitrite: NEGATIVE
Protein, ur: NEGATIVE mg/dL
Specific Gravity, Urine: 1.012 (ref 1.005–1.030)
pH: 7 (ref 5.0–8.0)

## 2022-04-22 LAB — COMPREHENSIVE METABOLIC PANEL
ALT: 20 U/L (ref 0–44)
AST: 16 U/L (ref 15–41)
Albumin: 4.2 g/dL (ref 3.5–5.0)
Alkaline Phosphatase: 34 U/L — ABNORMAL LOW (ref 38–126)
Anion gap: 7 (ref 5–15)
BUN: 18 mg/dL (ref 6–20)
CO2: 24 mmol/L (ref 22–32)
Calcium: 8.8 mg/dL — ABNORMAL LOW (ref 8.9–10.3)
Chloride: 105 mmol/L (ref 98–111)
Creatinine, Ser: 0.71 mg/dL (ref 0.44–1.00)
GFR, Estimated: >60 mL/min (ref >60)
Glucose, Bld: 95 mg/dL (ref 70–99)
Potassium: 3.7 mmol/L (ref 3.5–5.1)
Sodium: 136 mmol/L (ref 135–145)
Total Bilirubin: 0.9 mg/dL (ref 0.3–1.2)
Total Protein: 7.3 g/dL (ref 6.5–8.1)

## 2022-04-22 LAB — I-STAT BETA HCG BLOOD, ED (MC, WL, AP ONLY): I-stat hCG, quantitative: 561.7 m[IU]/mL — ABNORMAL HIGH (ref <5)

## 2022-04-22 LAB — LIPASE, BLOOD: Lipase: 32 U/L (ref 11–51)

## 2022-04-22 NOTE — ED Triage Notes (Signed)
Pt reports abdominal pain and bloating. Pt reports having a pos pregnancy test but since then has had a period

## 2022-04-22 NOTE — ED Provider Notes (Signed)
[facility]-EMERGENCY DEPT Provider Note   CSN: [id number] Arrival date & time: 04/22/22  1851     History  Chief Complaint  Patient presents with   Abdominal Pain    Natalie Kramer is a 26 y.o. female who presents to the emergency department complaining of abdominal pain and bloating onset 2 weeks.  She is sexually active.  Took a pregnancy test today that resulted in positive.  Her last menstrual period was 03/22/2022 and was normal.  Denies fever, chills, nausea, vomiting, urinary symptoms.  Denies concern for STI at this time.   The history is provided by the patient. No language interpreter was used.      Home Medications Prior to Admission medications   Medication Sig Start Date End Date Taking? Authorizing Provider  amphetamine-dextroamphetamine (ADDERALL XR) 15 MG 24 hr capsule Take 1 capsule by mouth every morning. 02/05/22     amphetamine-dextroamphetamine (ADDERALL XR) 20 MG 24 hr capsule Take 1 capsule by mouth daily. 03/05/22     amphetamine-dextroamphetamine (ADDERALL XR) 20 MG 24 hr capsule Take 1 capsule (20 mg total) by mouth daily. 04/06/22     Drospirenone-Ethinyl Estradiol-Levomefol (BEYAZ) 3-0.02-0.451 MG tablet Take 1 tablet by mouth once a day 04/14/21     Drospirenone-Ethinyl Estradiol-Levomefol (BEYAZ) 3-0.02-0.451 MG tablet Take 1 tablet by mouth daily as directed 04/23/21     Drospirenone-Ethinyl Estradiol-Levomefol (BEYAZ) 3-0.02-0.451 MG tablet Take 1 tablet by mouth every day  as directed for 28 days. 12/23/21     Drospirenone-Ethinyl Estradiol-Levomefol (SAFYRAL) 3-0.03-0.451 MG tablet Take 1 tablet by mouth daily.    [provider]  Drospirenone-Ethinyl Estradiol-Levomefol (SAFYRAL) 3-0.03-0.451 MG tablet TAKE 1 TABLET BY MOUTH ONCE A DAY AS DIRECTED 01/29/21 01/29/22  Candice Camp, MD  Drospirenone-Ethinyl Estradiol-Levomefol (SAFYRAL) 3-0.03-0.451 MG tablet TAKE 1 TABLET BY MOUTH ONCE A DAY AS DIRECTED 01/10/20 01/09/21  Candice Camp,  MD  levothyroxine (SYNTHROID, LEVOTHROID) 25 MCG tablet Take 25 mcg by mouth daily before breakfast.    [provider]  lisdexamfetamine (VYVANSE) 60 MG capsule Take 60 mg by mouth daily.    [provider]  naproxen (NAPROSYN) 500 MG tablet Take 1 tablet (500 mg total) by mouth 2 (two) times daily. 06/04/14   Arthor Captain, PA-C  thyroid [facility] THYROID) 60 MG tablet Take 1 tablet by mouth daily on an empty stomach. 01/22/22     thyroid (ARMOUR) 120 MG tablet Take 120 mg by mouth daily before breakfast.    [provider]      Allergies    Amoxicillin    Review of Systems   Review of Systems  Constitutional:  Negative for chills and fever.  Gastrointestinal:  Positive for abdominal pain. Negative for nausea and vomiting.  Genitourinary:  Negative for dysuria and hematuria.  All other systems reviewed and are negative.  Physical Exam Updated Vital Signs BP 136/72 (BP Location: Right Arm)   Pulse 98   Temp 98.4 F (36.9 C) (Oral)   Resp 16   Ht 5\' 2"  (1.575 m)   Wt 70.8 kg   SpO2 100%   BMI 28.53 kg/m  Physical Exam Vitals and nursing note reviewed. Exam conducted with a chaperone present.  Constitutional:      General: She is not in acute distress.    Appearance: Normal appearance. She is not ill-appearing, toxic-appearing or diaphoretic.  HENT:     Head: Normocephalic and atraumatic.     Right Ear: External ear normal.     Left  Ear: External ear normal.     Mouth/Throat:     Pharynx: No oropharyngeal exudate.  Eyes:     General: No scleral icterus.    Extraocular Movements: Extraocular movements intact.     Conjunctiva/sclera: Conjunctivae normal.  Cardiovascular:     Rate and Rhythm: Normal rate and regular rhythm.     Pulses: Normal pulses.     Heart sounds: Normal heart sounds.  Pulmonary:     Effort: Pulmonary effort is normal. No respiratory distress.     Breath sounds: Normal breath sounds. No wheezing.  Abdominal:     General:  Abdomen is flat. Bowel sounds are normal. There is no distension.     Palpations: Abdomen is soft. There is no mass.     Tenderness: There is no abdominal tenderness. There is no guarding or rebound.     Hernia: There is no hernia in the left inguinal area or right inguinal area.  Genitourinary:    Pubic Area: No rash.      Labia:        Right: No rash, tenderness, lesion or injury.        Left: No rash, tenderness, lesion or injury.      Vagina: No signs of injury and foreign body. Vaginal discharge present. No erythema, tenderness or bleeding.     Cervix: Normal.     Uterus: Normal. Not deviated, not enlarged, not fixed and not tender.      Adnexa: Right adnexa normal and left adnexa normal.     Comments: RN chaperone present for exam.  White discharge noted on exam.  Cervical os closed. Musculoskeletal:        General: Normal range of motion.     Cervical back: Normal range of motion and neck supple.  Lymphadenopathy:     Lower Body: No right inguinal adenopathy. No left inguinal adenopathy.  Skin:    General: Skin is warm and dry.  Neurological:     Mental Status: She is alert.  Psychiatric:        Behavior: Behavior normal.    ED Results / Procedures / Treatments   Labs (all labs ordered are listed, but only abnormal results are displayed) Labs Reviewed - No data to display  EKG None  Radiology No results found.  Procedures Procedures    Medications Ordered in ED Medications - No data to display  ED Course/ Medical Decision Making/ A&P Clinical Course as of 04/22/22 2352  Wed Apr 22, 2022  2051 I-stat hCG, quantitative(!): 561.7 [SB]  2054 Patient reevaluated resting comfortably on stretcher.  Discussed discharge treatment plan with patient at bedside.  Answered all available questions.  Patient for safe discharge at this time. [SB]    Clinical Course User Index [SB] Graziella Connery A, PA-C                           Medical Decision Making Amount and/or  Complexity of Data Reviewed Labs: ordered. Decision-making details documented in ED Course. Radiology: ordered.   Pt presents with abdominal bloating onset 2 weeks.  Had a positive home pregnancy test today. Vital signs, stable, patient afebrile. On exam, pt with no acute cardiovascular, respiratory, abdominal exam findings.  RN chaperone present for GU exam was notable for vaginal discharge, cervical os closed.  Differential diagnosis includes early pregnancy, acute cystitis, pancreatitis.     Labs:  I ordered, and personally interpreted labs.  The pertinent results include:  Urinalysis overall unremarkable. Lipase unremarkable. CMP and CBC overall unremarkable. I-STAT beta-hCG elevated at 561.7  Imaging: I ordered imaging studies including ultrasound OB comprehensive and transvaginal I independently visualized and interpreted imaging which showed:  1. No evidence of intrauterine pregnancy at this time. Serial beta  HCG measurements and follow-up ultrasound will be needed to document  live IUP and exclude ectopic pregnancy.  2. 2.2 cm complex left ovarian cyst, suspect corpus luteum cyst.  This could be reassessed at the time of follow-up.   I agree with the radiologist interpretation   Disposition: Patient is a suspicious for abdominal bloating in the setting of elevated hCG.  Could represent likely early pregnancy.  Doubt pancreatitis, acute cystitis at this time. After consideration of the diagnostic results and the patients response to treatment, I feel that the patient would benefit from Discharge home.  Patient given strict instructions to call her OB/GYN tomorrow to set up a follow-up appointment on today's ED visit for likely serial hCGs and repeat ultrasound in the office.  Also discussed with patient importance of starting prenatal vitamins at this time.  Supportive care measures and strict return precautions discussed with patient at bedside. Pt acknowledges and verbalizes  understanding. Pt appears safe for discharge. Follow up as indicated in discharge paperwork.    This chart was dictated using voice recognition software, Dragon. Despite the best efforts of this provider to proofread and correct errors, errors may still occur which can change documentation meaning.  Final Clinical Impression(s) / ED Diagnoses Final diagnoses:  Abdominal pain, unspecified abdominal location  Elevated serum hCG    Rx / DC Orders ED Discharge Orders     None         Ettel Albergo A, PA-C 04/22/22 2353    Charlynne PanderYao, David Hsienta, MD 04/23/22 [phone]

## 2022-04-22 NOTE — Discharge Instructions (Signed)
It was a pleasure taking care of you today!   Your labs are overall unremarkable today. Your ultrasound did not show a pregnancy within the uterus however it may be too early to determine this at this time.  Your hCG was elevated today in the ED.  It is important that you call and set up a follow-up appointment with your OB/GYN specialist for a follow-up appointment and repeat hCG within 48 hours.  Also some point to maintain follow-up with OB/GYN specialist for repeat ultrasound as indicated by your specialist.  You may go ahead and start taking over-the-counter prenatal vitamins. You may follow-up with your primary care provider as needed.  Return to the ED if you have increasing/worsening abdominal pain, vaginal bleeding, fever, or worsening symptoms.

## 2022-05-14 ENCOUNTER — Other Ambulatory Visit (HOSPITAL_COMMUNITY): Payer: Self-pay

## 2022-05-14 MED ORDER — DOXYLAMINE-PYRIDOXINE 10-10 MG PO TBEC
DELAYED_RELEASE_TABLET | ORAL | 12 refills | Status: DC
  Filled 2022-05-14 – 2022-05-15 (×2): qty 60, 30d supply, fill #0

## 2022-05-15 ENCOUNTER — Other Ambulatory Visit (HOSPITAL_COMMUNITY): Payer: Self-pay

## 2022-05-19 ENCOUNTER — Other Ambulatory Visit (HOSPITAL_COMMUNITY): Payer: Self-pay

## 2022-05-19 LAB — OB RESULTS CONSOLE GC/CHLAMYDIA
Chlamydia: NEGATIVE
Neisseria Gonorrhea: NEGATIVE

## 2022-05-19 LAB — OB RESULTS CONSOLE HEPATITIS B SURFACE ANTIGEN: Hepatitis B Surface Ag: NEGATIVE

## 2022-05-19 LAB — OB RESULTS CONSOLE ABO/RH: RH Type: POSITIVE

## 2022-05-19 LAB — HEPATITIS C ANTIBODY: HCV Ab: NEGATIVE

## 2022-05-19 LAB — OB RESULTS CONSOLE HIV ANTIBODY (ROUTINE TESTING): HIV: NONREACTIVE

## 2022-05-19 LAB — OB RESULTS CONSOLE RUBELLA ANTIBODY, IGM: Rubella: IMMUNE

## 2022-05-19 LAB — OB RESULTS CONSOLE RPR: RPR: NONREACTIVE

## 2022-06-19 ENCOUNTER — Other Ambulatory Visit (HOSPITAL_COMMUNITY): Payer: Self-pay

## 2022-07-20 ENCOUNTER — Other Ambulatory Visit (HOSPITAL_COMMUNITY): Payer: Self-pay

## 2022-08-20 ENCOUNTER — Other Ambulatory Visit (HOSPITAL_COMMUNITY): Payer: Self-pay

## 2022-08-20 MED ORDER — THYROID 60 MG PO TABS
60.0000 mg | ORAL_TABLET | Freq: Every day | ORAL | 2 refills | Status: DC
  Filled 2022-08-20: qty 30, 30d supply, fill #0
  Filled 2022-09-15: qty 30, 30d supply, fill #1
  Filled 2022-10-15: qty 30, 30d supply, fill #2

## 2022-09-15 ENCOUNTER — Other Ambulatory Visit (HOSPITAL_COMMUNITY): Payer: Self-pay

## 2022-10-16 ENCOUNTER — Other Ambulatory Visit (HOSPITAL_COMMUNITY): Payer: Self-pay

## 2022-11-12 ENCOUNTER — Other Ambulatory Visit (HOSPITAL_COMMUNITY): Payer: Self-pay

## 2022-11-19 ENCOUNTER — Other Ambulatory Visit (HOSPITAL_COMMUNITY): Payer: Self-pay

## 2022-11-19 MED ORDER — THYROID 60 MG PO TABS
60.0000 mg | ORAL_TABLET | Freq: Every day | ORAL | 1 refills | Status: DC
  Filled 2022-11-19 – 2023-02-18 (×3): qty 30, 30d supply, fill #0
  Filled 2023-04-01 – 2023-04-26 (×3): qty 30, 30d supply, fill #1

## 2022-11-19 MED ORDER — THYROID 60 MG PO TABS
60.0000 mg | ORAL_TABLET | Freq: Every day | ORAL | 2 refills | Status: DC
  Filled 2022-11-19: qty 30, 30d supply, fill #0
  Filled 2022-12-13: qty 30, 30d supply, fill #1

## 2022-11-20 ENCOUNTER — Other Ambulatory Visit (HOSPITAL_COMMUNITY): Payer: Self-pay

## 2022-11-23 NOTE — L&D Delivery Note (Signed)
Delivery Note  SVD viable female Apgars 9,9 over 1st degree lac.  Placenta delivered spontaneously intact with 3VC. Repair with 2-0 Chromic with good support and hemostasis noted.  R/V exam confirms.  .   Mother and baby to couplet care and are doing well.  EBL 93cc  Louretta Shorten, MD

## 2022-12-07 LAB — OB RESULTS CONSOLE GBS: GBS: NEGATIVE

## 2022-12-14 ENCOUNTER — Other Ambulatory Visit: Payer: Self-pay

## 2022-12-22 ENCOUNTER — Inpatient Hospital Stay (HOSPITAL_COMMUNITY): Payer: 59 | Admitting: Anesthesiology

## 2022-12-22 ENCOUNTER — Encounter (HOSPITAL_COMMUNITY): Payer: Self-pay | Admitting: Obstetrics and Gynecology

## 2022-12-22 ENCOUNTER — Other Ambulatory Visit: Payer: Self-pay

## 2022-12-22 ENCOUNTER — Inpatient Hospital Stay (HOSPITAL_COMMUNITY)
Admission: AD | Admit: 2022-12-22 | Discharge: 2022-12-23 | DRG: 807 | Disposition: A | Discharge status: Home / Self Care | Payer: 59 | Attending: Obstetrics and Gynecology | Admitting: Obstetrics and Gynecology

## 2022-12-22 DIAGNOSIS — O26893 Other specified pregnancy related conditions, third trimester: Secondary | ICD-10-CM | POA: Diagnosis present

## 2022-12-22 DIAGNOSIS — Z87442 Personal history of urinary calculi: Secondary | ICD-10-CM

## 2022-12-22 DIAGNOSIS — O99214 Obesity complicating childbirth: Principal | ICD-10-CM | POA: Diagnosis present

## 2022-12-22 DIAGNOSIS — Z3A39 39 weeks gestation of pregnancy: Secondary | ICD-10-CM | POA: Diagnosis not present

## 2022-12-22 HISTORY — DX: Hypothyroidism, unspecified: E03.9

## 2022-12-22 HISTORY — DX: Calculus of kidney: N20.0

## 2022-12-22 LAB — CBC
HCT: 40.2 % (ref 36.0–46.0)
Hemoglobin: 13.8 g/dL (ref 12.0–15.0)
MCH: 31.3 pg (ref 26.0–34.0)
MCHC: 34.3 g/dL (ref 30.0–36.0)
MCV: 91.2 fL (ref 80.0–100.0)
Platelets: 200 10*3/uL (ref 150–400)
RBC: 4.41 MIL/uL (ref 3.87–5.11)
RDW: 12.6 % (ref 11.5–15.5)
WBC: 20.2 10*3/uL — ABNORMAL HIGH (ref 4.0–10.5)
nRBC: 0 % (ref 0.0–0.2)

## 2022-12-22 LAB — COMPREHENSIVE METABOLIC PANEL
ALT: 21 U/L (ref 0–44)
AST: 30 U/L (ref 15–41)
Albumin: 3.2 g/dL — ABNORMAL LOW (ref 3.5–5.0)
Alkaline Phosphatase: 131 U/L — ABNORMAL HIGH (ref 38–126)
Anion gap: 13 (ref 5–15)
BUN: 16 mg/dL (ref 6–20)
CO2: 20 mmol/L — ABNORMAL LOW (ref 22–32)
Calcium: 9 mg/dL (ref 8.9–10.3)
Chloride: 102 mmol/L (ref 98–111)
Creatinine, Ser: 0.7 mg/dL (ref 0.44–1.00)
GFR, Estimated: >60 mL/min (ref >60)
Glucose, Bld: 101 mg/dL — ABNORMAL HIGH (ref 70–99)
Potassium: 4.2 mmol/L (ref 3.5–5.1)
Sodium: 135 mmol/L (ref 135–145)
Total Bilirubin: 0.5 mg/dL (ref 0.3–1.2)
Total Protein: 6.5 g/dL (ref 6.5–8.1)

## 2022-12-22 LAB — AMNISURE RUPTURE OF MEMBRANE (ROM) NOT AT ARMC: Amnisure ROM: POSITIVE

## 2022-12-22 LAB — TYPE AND SCREEN
ABO/RH(D): A POS
Antibody Screen: NEGATIVE

## 2022-12-22 LAB — RPR: RPR Ser Ql: NONREACTIVE

## 2022-12-22 LAB — TSH: TSH: 3.417 u[IU]/mL (ref 0.350–4.500)

## 2022-12-22 MED ORDER — THYROID 60 MG PO TABS: 60.0000 mg | ORAL_TABLET | Freq: Every day | ORAL | Status: DC

## 2022-12-22 MED ORDER — OXYCODONE-ACETAMINOPHEN 5-325 MG PO TABS: 1.0000 | ORAL_TABLET | ORAL | Status: DC | PRN

## 2022-12-22 MED ORDER — FLEET ENEMA 7-19 GM/118ML RE ENEM: 1.0000 | ENEMA | RECTAL | Status: DC | PRN

## 2022-12-22 MED ORDER — FENTANYL-BUPIVACAINE-NACL 0.5-0.125-0.9 MG/250ML-% EP SOLN
12.0000 mL/h | EPIDURAL | Status: DC | PRN
  Administered 2022-12-22: 12 mL/h via EPIDURAL
  Filled 2022-12-22: qty 250

## 2022-12-22 MED ORDER — ACETAMINOPHEN 325 MG PO TABS: 650.0000 mg | ORAL_TABLET | ORAL | Status: DC | PRN

## 2022-12-22 MED ORDER — IBUPROFEN 600 MG PO TABS
600.0000 mg | ORAL_TABLET | Freq: Four times a day (QID) | ORAL | Status: DC
  Administered 2022-12-22 – 2022-12-23 (×4): 600 mg via ORAL
  Filled 2022-12-22 (×4): qty 1

## 2022-12-22 MED ORDER — ONDANSETRON HCL 4 MG/2ML IJ SOLN: 4.0000 mg | INTRAMUSCULAR | Status: DC | PRN

## 2022-12-22 MED ORDER — MEASLES, MUMPS & RUBELLA VAC IJ SOLR: 0.5000 mL | Freq: Once | INTRAMUSCULAR | Status: DC

## 2022-12-22 MED ORDER — BUPIVACAINE HCL (PF) 0.25 % IJ SOLN
INTRAMUSCULAR | Status: DC | PRN
  Administered 2022-12-22: 10 mL via EPIDURAL

## 2022-12-22 MED ORDER — DIBUCAINE (PERIANAL) 1 % EX OINT: 1.0000 | TOPICAL_OINTMENT | CUTANEOUS | Status: DC | PRN

## 2022-12-22 MED ORDER — SIMETHICONE 80 MG PO CHEW: 80.0000 mg | CHEWABLE_TABLET | ORAL | Status: DC | PRN

## 2022-12-22 MED ORDER — SENNOSIDES-DOCUSATE SODIUM 8.6-50 MG PO TABS
2.0000 | ORAL_TABLET | Freq: Every day | ORAL | Status: DC
  Filled 2022-12-22: qty 2

## 2022-12-22 MED ORDER — FENTANYL CITRATE (PF) 100 MCG/2ML IJ SOLN
INTRAMUSCULAR | Status: DC | PRN
  Administered 2022-12-22: 100 ug via EPIDURAL

## 2022-12-22 MED ORDER — OXYTOCIN-SODIUM CHLORIDE 30-0.9 UT/500ML-% IV SOLN
2.5000 [IU]/h | INTRAVENOUS | Status: DC
  Filled 2022-12-22: qty 500

## 2022-12-22 MED ORDER — EPHEDRINE 5 MG/ML INJ: 10.0000 mg | INTRAVENOUS | Status: DC | PRN

## 2022-12-22 MED ORDER — TETANUS-DIPHTH-ACELL PERTUSSIS 5-2.5-18.5 LF-MCG/0.5 IM SUSY: 0.5000 mL | PREFILLED_SYRINGE | Freq: Once | INTRAMUSCULAR | Status: DC

## 2022-12-22 MED ORDER — DIPHENHYDRAMINE HCL 50 MG/ML IJ SOLN: 12.5000 mg | INTRAMUSCULAR | Status: DC | PRN

## 2022-12-22 MED ORDER — LACTATED RINGERS IV SOLN: 500.0000 mL | INTRAVENOUS | Status: DC | PRN

## 2022-12-22 MED ORDER — LACTATED RINGERS IV SOLN: INTRAVENOUS | Status: DC

## 2022-12-22 MED ORDER — PRENATAL MULTIVITAMIN CH
1.0000 | ORAL_TABLET | Freq: Every day | ORAL | Status: DC
  Administered 2022-12-22 – 2022-12-23 (×2): 1 via ORAL
  Filled 2022-12-22 (×2): qty 1

## 2022-12-22 MED ORDER — DIPHENHYDRAMINE HCL 25 MG PO CAPS: 25.0000 mg | ORAL_CAPSULE | Freq: Four times a day (QID) | ORAL | Status: DC | PRN

## 2022-12-22 MED ORDER — LACTATED RINGERS IV SOLN: 500.0000 mL | Freq: Once | INTRAVENOUS | Status: DC

## 2022-12-22 MED ORDER — WITCH HAZEL-GLYCERIN EX PADS: 1.0000 | MEDICATED_PAD | CUTANEOUS | Status: DC | PRN

## 2022-12-22 MED ORDER — PHENYLEPHRINE 80 MCG/ML (10ML) SYRINGE FOR IV PUSH (FOR BLOOD PRESSURE SUPPORT): 80.0000 ug | PREFILLED_SYRINGE | INTRAVENOUS | Status: DC | PRN

## 2022-12-22 MED ORDER — FENTANYL CITRATE (PF) 100 MCG/2ML IJ SOLN
50.0000 ug | INTRAMUSCULAR | Status: DC | PRN
  Administered 2022-12-22: 100 ug via INTRAVENOUS
  Filled 2022-12-22 (×2): qty 2

## 2022-12-22 MED ORDER — OXYTOCIN BOLUS FROM INFUSION
333.0000 mL | Freq: Once | INTRAVENOUS | Status: AC
  Administered 2022-12-22: 333 mL via INTRAVENOUS

## 2022-12-22 MED ORDER — OXYCODONE-ACETAMINOPHEN 5-325 MG PO TABS: 2.0000 | ORAL_TABLET | ORAL | Status: DC | PRN

## 2022-12-22 MED ORDER — ZOLPIDEM TARTRATE 5 MG PO TABS: 5.0000 mg | ORAL_TABLET | Freq: Every evening | ORAL | Status: DC | PRN

## 2022-12-22 MED ORDER — THYROID 60 MG PO TABS
60.0000 mg | ORAL_TABLET | Freq: Every day | ORAL | Status: DC
  Administered 2022-12-22 – 2022-12-23 (×2): 60 mg via ORAL
  Filled 2022-12-22 (×2): qty 1

## 2022-12-22 MED ORDER — SOD CITRATE-CITRIC ACID 500-334 MG/5ML PO SOLN: 30.0000 mL | ORAL | Status: DC | PRN

## 2022-12-22 MED ORDER — LIDOCAINE-EPINEPHRINE (PF) 2 %-1:200000 IJ SOLN
INTRAMUSCULAR | Status: DC | PRN
  Administered 2022-12-22 (×2): 4 mL via EPIDURAL

## 2022-12-22 MED ORDER — ONDANSETRON HCL 4 MG PO TABS: 4.0000 mg | ORAL_TABLET | ORAL | Status: DC | PRN

## 2022-12-22 MED ORDER — COCONUT OIL OIL: 1.0000 | TOPICAL_OIL | Status: DC | PRN

## 2022-12-22 MED ORDER — BENZOCAINE-MENTHOL 20-0.5 % EX AERO
1.0000 | INHALATION_SPRAY | CUTANEOUS | Status: DC | PRN
  Administered 2022-12-23: 1 via TOPICAL
  Filled 2022-12-22: qty 56

## 2022-12-22 MED ORDER — ONDANSETRON HCL 4 MG/2ML IJ SOLN: 4.0000 mg | Freq: Four times a day (QID) | INTRAMUSCULAR | Status: DC | PRN

## 2022-12-22 MED ORDER — MEDROXYPROGESTERONE ACETATE 150 MG/ML IM SUSP: 150.0000 mg | INTRAMUSCULAR | Status: DC | PRN

## 2022-12-22 MED ORDER — LIDOCAINE HCL (PF) 1 % IJ SOLN
INTRAMUSCULAR | Status: DC | PRN
  Administered 2022-12-22 (×2): 4 mL via EPIDURAL

## 2022-12-22 MED ORDER — LIDOCAINE HCL (PF) 1 % IJ SOLN: 30.0000 mL | INTRAMUSCULAR | Status: DC | PRN

## 2022-12-22 NOTE — Lactation Note (Signed)
This note was copied from a baby's chart. Lactation Consultation Note  Patient Name: Natalie Kramer VOHYW'V Date: 12/22/2022 Age : 29 hours old  Reason for consult: Initial assessment;Primapara;1st time breastfeeding;Term (LC encouraged mom to call with feeding cues for a latch assessment) Per mom has attempted several times since the last long feeding at 1330 and baby has been sleepy.  LC reviewed  breast feeding goals for 24 hours to feed with feeding cues and by 3 hours STS.   Maternal Data Does the patient have breastfeeding experience prior to this delivery?: No  Feeding Mother's Current Feeding Choice: Breast Milk  LATCH Scores since birth - 104 - 7 -       Interventions Interventions: Breast feeding basics reviewed;Education  Discharge Pump: DEBP;Manual;Hands Free;Personal WIC Program: No  Consult Status Consult Status: Follow-up Date: 12/22/22 Follow-up type: In-patient    [place] 12/22/2022, 7:27 PM

## 2022-12-22 NOTE — Progress Notes (Signed)
Natalie Kramer is a 27 y.o. G1P0 at [redacted]w[redacted]d by LMP admitted for active labor  Subjective:   Objective: BP 127/77   Pulse (!) 121   Temp 98.1 F (36.7 C) (Oral)   Resp 18   Wt 107 kg   SpO2 94%   BMI 43.16 kg/m  No intake/output data recorded. No intake/output data recorded.  FHT:  FHR: 142 bpm, variability: moderate,  accelerations:  Present,  decelerations:  Absent but had previously prolonged decels resolved with position change UC:   regular, every 3 minutes SVE:   ant lip, LOP  Labs: Lab Results  Component Value Date   WBC 20.2 (H) 12/22/2022   HGB 13.8 12/22/2022   HCT 40.2 12/22/2022   MCV 91.2 12/22/2022   PLT 200 12/22/2022    Assessment / Plan: Spontaneous labor, progressing normally  Labor: Progressing normally Preeclampsia:     Fetal Wellbeing:  Category I Pain Control:  Epidural I/D:     Anticipated MOD:  NSVD  Luz Lex, MD 12/22/2022, 8:45 AM

## 2022-12-22 NOTE — MAU Note (Signed)
.  Natalie Kramer is a 27 y.o. at [redacted]w[redacted]d here in MAU reporting:   Pt reports SROM at 2345.  Pt states they had a membrane sweep today in the office.  GBS neg. +fm No bleeding.   BP 138/80 95 02  Resp 19 Temp 97.8    Onset of complaint:  Pain score: 8/10 lower abdominal pain. Contractions.    FHT:120 Lab orders placed from triage:  Maryann Alar slide

## 2022-12-22 NOTE — Anesthesia Procedure Notes (Signed)
Epidural Patient location during procedure: OB Start time: 12/22/2022 4:09 AM End time: 12/22/2022 4:12 AM  Staffing Anesthesiologist: Brennan Bailey, MD Performed: anesthesiologist   Preanesthetic Checklist Completed: patient identified, IV checked, risks and benefits discussed, monitors and equipment checked, pre-op evaluation and timeout performed  Epidural Patient position: sitting Prep: DuraPrep and site prepped and draped Patient monitoring: continuous pulse ox, blood pressure and heart rate Approach: midline Location: L3-L4 Injection technique: LOR air  Needle:  Needle type: Tuohy  Needle gauge: 17 G Needle length: 9 cm Needle insertion depth: 6 cm Catheter type: closed end flexible Catheter size: 19 Gauge Catheter at skin depth: 11 cm Test dose: negative and Other (1% lidocaine)  Assessment Events: blood not aspirated, no cerebrospinal fluid, injection not painful, no injection resistance, no paresthesia and negative IV test  Additional Notes Patient identified. Risks, benefits, and alternatives discussed with patient including but not limited to bleeding, infection, nerve damage, paralysis, failed block, incomplete pain control, headache, blood pressure changes, nausea, vomiting, reactions to medication, itching, and postpartum back pain. Confirmed with bedside nurse the patient's most recent platelet count. Confirmed with patient that they are not currently taking any anticoagulation, have any bleeding history, or any family history of bleeding disorders. Patient expressed understanding and wished to proceed. All questions were answered. Sterile technique was used throughout the entire procedure. Please see nursing notes for vital signs.   Crisp LOR after one needle redirection. Test dose was given through epidural catheter and negative prior to continuing to dose epidural or start infusion. Warning signs of high block given to the patient including shortness of breath,  tingling/numbness in hands, complete motor block, or any concerning symptoms with instructions to call for help. Patient was given instructions on fall risk and not to get out of bed. All questions and concerns addressed with instructions to call with any issues or inadequate analgesia.  Reason for block:procedure for pain

## 2022-12-22 NOTE — Progress Notes (Signed)
Delayed Entry Progress Note  Presented to bedside after notified by RN re prolonged  deceleration. Excellent recovery to baseline with change in position, increased IVF. Cervix was checked by RN, found to be 5/90/-1. We discussed that given excellent recovery of FHR now with accelerations and no decelerations, okay to continue laboring. However, if nonreassuring fetal heart tracing, patient understands recommendations for a c-section.   She remains without pitocin. Will start prn. Continue close monitoring.   Hurshel Party, MD

## 2022-12-22 NOTE — Anesthesia Preprocedure Evaluation (Signed)
Anesthesia Evaluation  Patient identified by MRN, date of birth, ID band Patient awake    Reviewed: Allergy & Precautions, Patient's Chart, lab work & pertinent test results  History of Anesthesia Complications Negative for: history of anesthetic complications  Airway Mallampati: II  TM Distance: >3 FB Neck ROM: Full    Dental no notable dental hx.    Pulmonary neg pulmonary ROS   Pulmonary exam normal        Cardiovascular negative cardio ROS Normal cardiovascular exam     Neuro/Psych negative neurological ROS  negative psych ROS   GI/Hepatic negative GI ROS, Neg liver ROS,,,  Endo/Other  Hypothyroidism  Morbid obesity  Renal/GU negative Renal ROS  negative genitourinary   Musculoskeletal negative musculoskeletal ROS (+)    Abdominal   Peds  Hematology negative hematology ROS (+)   Anesthesia Other Findings Day of surgery medications reviewed with patient.  Reproductive/Obstetrics (+) Pregnancy                              Anesthesia Physical Anesthesia Plan  ASA: 3  Anesthesia Plan: Epidural   Post-op Pain Management:    Induction:   PONV Risk Score and Plan: Treatment may vary due to age or medical condition  Airway Management Planned: Natural Airway  Additional Equipment: Fetal Monitoring  Intra-op Plan:   Post-operative Plan:   Informed Consent: I have reviewed the patients History and Physical, chart, labs and discussed the procedure including the risks, benefits and alternatives for the proposed anesthesia with the patient or authorized representative who has indicated his/her understanding and acceptance.       Plan Discussed with:   Anesthesia Plan Comments:          Anesthesia Quick Evaluation

## 2022-12-22 NOTE — H&P (Signed)
Natalie Kramer is a 27 y.o. G1P0 at [redacted]w[redacted]d admitted for SROM and strong contractions - was checked in the MAU, amnisure positive. Cervix was 2cm on two checks but contracting painfully.   She has hypothyroidism, not on any medications. Also has history of nephrolithiasis, s/p lithotripsy - she passed a kidney stone during this pregnancy.  OB History     Gravida  1   Para      Term      Preterm      AB      Living         SAB      IAB      Ectopic      Multiple      Live Births             Past Medical History:  Diagnosis Date   Hypothyroidism    Kidney stones    Past Surgical History:  Procedure Laterality Date   MYRINGOTOMY WITH TUBE PLACEMENT     WISDOM TOOTH EXTRACTION Bilateral    Family History: family history includes Diabetes in her father; Hyperlipidemia in her father; Hypertension in her father. Social History:  reports that she has never smoked. She does not have any smokeless tobacco history on file. She reports that she does not drink alcohol and does not use drugs.     Maternal Diabetes: No Genetic Screening: Normal Maternal Ultrasounds/Referrals: Normal Fetal Ultrasounds or other Referrals:  None Maternal Substance Abuse:  No Significant Maternal Medications:  None Significant Maternal Lab Results:  Group B Strep negative Number of Prenatal Visits:greater than 3 verified prenatal visits Other Comments:  None  Review of Systems History Dilation: 5.5 Effacement (%): 90 Station: -1 Exam by:: Tommie Raymond, RN Blood pressure (!) 121/98, pulse (!) 115, weight 107 kg, SpO2 94 %.  Vitals and nursing note reviewed. Exam conducted with a chaperone present.  Constitutional:      Appearance: Normal appearance.  HENT:     Head: Normocephalic.  Eyes:     Pupils: Pupils are equal, round, and reactive to light.  Cardiovascular:     Rate and Rhythm: Normal rate and regular rhythm.     Pulses: Normal pulses.  Abdominal:     General: Abdomen is  Gravid, nontender Neurological:     Mental Status: She is alert.   Prenatal labs: ABO, Rh: --/--/A POS (01/30 0257) Antibody: NEG (01/30 0257) Rubella:   RPR:    HBsAg:    HIV:    GBS: Negative/-- (01/15 0000)   Assessment/Plan: Natalie Kramer is a 27 y.o. G1P0 at [redacted]w[redacted]d admitted for SROM  Painful regular contractions without pitocin, can start pit prn.  Hypothyroidism - will check TSH Mild range Bps on arrival - CBC wnl. Will check CMP.  GBS neg   Charlotta Newton 12/22/2022, 6:18 AM

## 2022-12-23 ENCOUNTER — Other Ambulatory Visit (HOSPITAL_COMMUNITY): Payer: Self-pay

## 2022-12-23 LAB — CBC
HCT: 34.3 % — ABNORMAL LOW (ref 36.0–46.0)
Hemoglobin: 11.8 g/dL — ABNORMAL LOW (ref 12.0–15.0)
MCH: 31.6 pg (ref 26.0–34.0)
MCHC: 34.4 g/dL (ref 30.0–36.0)
MCV: 91.7 fL (ref 80.0–100.0)
Platelets: 162 10*3/uL (ref 150–400)
RBC: 3.74 MIL/uL — ABNORMAL LOW (ref 3.87–5.11)
RDW: 12.9 % (ref 11.5–15.5)
WBC: 17.4 10*3/uL — ABNORMAL HIGH (ref 4.0–10.5)
nRBC: 0 % (ref 0.0–0.2)

## 2022-12-23 MED ORDER — ACETAMINOPHEN 325 MG PO TABS
650.0000 mg | ORAL_TABLET | ORAL | 0 refills | Status: AC | PRN
  Filled 2022-12-23: qty 30, 3d supply, fill #0

## 2022-12-23 MED ORDER — IBUPROFEN 600 MG PO TABS
600.0000 mg | ORAL_TABLET | Freq: Four times a day (QID) | ORAL | 0 refills | Status: AC
  Filled 2022-12-23 – 2023-05-27 (×2): qty 30, 8d supply, fill #0

## 2022-12-23 NOTE — Anesthesia Postprocedure Evaluation (Signed)
Anesthesia Post Note  Patient: Natalie Kramer  Procedure(s) Performed: AN AD HOC LABOR EPIDURAL     Patient location during evaluation: Mother Baby Anesthesia Type: Epidural Level of consciousness: awake and alert Pain management: pain level controlled Vital Signs Assessment: post-procedure vital signs reviewed and stable Respiratory status: spontaneous breathing, nonlabored ventilation and respiratory function stable Cardiovascular status: stable Postop Assessment: no headache, no backache and epidural receding Anesthetic complications: no  No notable events documented.  Last Vitals:  Vitals:   12/23/22 0543 12/23/22 0648  BP: (!) 143/91 124/74  Pulse: (!) 101 98  Resp: 19   Temp: 36.8 C   SpO2: 95%     Last Pain:  Vitals:   12/23/22 0543  TempSrc: Oral  PainSc: 3    Pain Goal:                   Sandrea Matte

## 2022-12-23 NOTE — Lactation Note (Signed)
This note was copied from a baby's chart. Lactation Consultation Note  Patient Name: Natalie Kramer INOMV'E Date: 12/23/2022 Reason for consult: Mother's request;Follow-up assessment;Primapara;Term;Maternal endocrine disorder Age:29 hours Mom wanted assistance in latching stated her nipples are sore. The baby BF for an hour at last feeding and put very sm. Blister to tip of Lt. Nipple. Bruising to Rt. [place]. Baby finishing up bath and LC assisted to breast. Mom denied painful latch, stated they were sore.  Newborn feeding habits, STS, I&O, body positioning, support, shells, and pre-pumping suggested to evert nipples reviewed. Offered to set up DEBP and mom politely declined stating she would use her personal pump to pre-pump  before latching. Mom has inserts in flanges to 17 which is good. Praised mom. Encouraged to call for assistance or questions.  Maternal Data Has patient been taught Hand Expression?: Yes Does the patient have breastfeeding experience prior to this delivery?: No  Feeding    LATCH Score Latch: Grasps breast easily, tongue down, lips flanged, rhythmical sucking.  Audible Swallowing: None  Type of Nipple: Flat  Comfort (Breast/Nipple): Filling, red/small blisters or bruises, mild/mod discomfort (bruises to Rt. and sm. blister to Lt.)  Hold (Positioning): Assistance needed to correctly position infant at breast and maintain latch.  LATCH Score: 5   Lactation Tools Discussed/Used Tools: Pump;Flanges (mom wants to use her pump) Flange Size:  (mom has size 17 flanges) Breast pump type: Manual Pump Education: Milk Storage Pumping frequency: q3hr  Interventions Interventions: Breast feeding basics reviewed;Adjust position;Assisted with latch;Support pillows;Skin to skin;Position options;Breast massage;Hand express;Pre-pump if needed;Shells;LC Services brochure;Breast compression  Discharge    Consult Status Consult Status: Follow-up Date:  12/23/22 Follow-up type: In-patient    Breslyn Abdo, Elta [place] 12/23/2022, 1:00 AM

## 2022-12-23 NOTE — Progress Notes (Signed)
Postpartum Progress Note  Post Partum Day 1 s/p spontaneous vaginal delivery.  Patient reports well-controlled pain, ambulating without difficulty, voiding spontaneously, tolerating PO.  Vaginal bleeding is appropriate.   Objective: Blood pressure 124/74, pulse 98, temperature 98.2 F (36.8 C), temperature source Oral, resp. rate 19, weight 107 kg, SpO2 95 %, unknown if currently breastfeeding.  Physical Exam:  General: alert and no distress Lochia: appropriate Uterine Fundus: firm DVT Evaluation: No evidence of DVT seen on physical exam.  Recent Labs    12/22/22 0257 12/23/22 0457  HGB 13.8 11.8*  HCT 40.2 34.3*    Assessment/Plan: Postpartum Day 1, s/p vaginal delivery. Continue routine postpartum care Lactation following Baby boy - desires circ, will perform when cleared Anticipate discharge home today   LOS: 1 day   Carlyon Shadow 12/23/2022, 7:41 AM

## 2022-12-24 NOTE — Discharge Summary (Signed)
Obstetric Discharge Summary  Natalie Kramer is a 27 y.o. female that presented on 12/22/2022 for leakage of fluid and contractions.  She was admitted to labor and delivery for labor.  Her labor course was uncomplicated and she delivered a viable female infant on 12/22/22.  Her postpartum course was uncomplicated and on PPD#1, she reported well controlled pain, spontaneous voiding, ambulating without difficulty, and tolerating PO.  She was stable for discharge home on 12/23/22 with plans for in-office follow up.  Hemoglobin  Date Value Ref Range Status  12/23/2022 11.8 (L) 12.0 - 15.0 g/dL Final   HCT  Date Value Ref Range Status  12/23/2022 34.3 (L) 36.0 - 46.0 % Final    Physical Exam:  General: alert and no distress Lochia: appropriate Uterine Fundus: firm DVT Evaluation: No evidence of DVT seen on physical exam.  Discharge Diagnoses: Term Pregnancy-delivered  Discharge Information: Date: 12/24/2022 Activity: Pelvic rest, as tolerated Diet: routine Medications: Tylenol, motrin Condition: stable Instructions: Refer to practice specific booklet.  Discussed prior to discharge.  Discharge to: [place] Follow up.   Why: Please follow up for a 6 week blood pressure check. Contact information: [place] [address] [phone]                 Newborn Data: Live born female  Birth Weight: 7 lb 2.3 oz (3240 g) APGAR: 35, 9  Newborn Delivery   Birth date/time: 12/22/2022 11:27:00 Delivery type: Vaginal, Spontaneous      Home with mother.  Carlyon Shadow 12/24/2022, 12:10 AM

## 2022-12-27 ENCOUNTER — Inpatient Hospital Stay (HOSPITAL_COMMUNITY): Admit: 2022-12-27 | Payer: 59 | Admitting: Obstetrics and Gynecology

## 2022-12-31 ENCOUNTER — Other Ambulatory Visit (HOSPITAL_COMMUNITY): Payer: Self-pay

## 2023-01-02 ENCOUNTER — Telehealth (HOSPITAL_COMMUNITY): Payer: Self-pay

## 2023-01-02 NOTE — Telephone Encounter (Signed)
Patient did not answer phone call. Voicemail left for patient.   Sharyn Lull [facility] 01/02/23,1357

## 2023-01-03 ENCOUNTER — Other Ambulatory Visit (HOSPITAL_COMMUNITY): Payer: Self-pay

## 2023-01-19 ENCOUNTER — Other Ambulatory Visit (HOSPITAL_COMMUNITY): Payer: Self-pay

## 2023-01-19 MED ORDER — THYROID 60 MG PO TABS
ORAL_TABLET | ORAL | 1 refills | Status: DC
  Filled 2023-01-19: qty 30, 30d supply, fill #0
  Filled 2023-03-04 – 2023-03-25 (×2): qty 30, 30d supply, fill #1

## 2023-02-03 ENCOUNTER — Other Ambulatory Visit (HOSPITAL_COMMUNITY): Payer: Self-pay

## 2023-02-03 ENCOUNTER — Other Ambulatory Visit: Payer: Self-pay

## 2023-02-03 MED ORDER — NORETHINDRONE 0.35 MG PO TABS
1.0000 | ORAL_TABLET | Freq: Every day | ORAL | 12 refills | Status: AC
  Filled 2023-02-03: qty 28, 28d supply, fill #0
  Filled 2023-03-04: qty 28, 28d supply, fill #1
  Filled 2023-04-01: qty 28, 28d supply, fill #2
  Filled 2023-04-29: qty 28, 28d supply, fill #3
  Filled 2023-05-27: qty 28, 28d supply, fill #4
  Filled 2023-06-18: qty 28, 28d supply, fill #5
  Filled 2023-07-19: qty 28, 28d supply, fill #6
  Filled 2023-08-16: qty 28, 28d supply, fill #7

## 2023-02-09 ENCOUNTER — Other Ambulatory Visit (HOSPITAL_COMMUNITY): Payer: Self-pay

## 2023-02-18 ENCOUNTER — Other Ambulatory Visit (HOSPITAL_COMMUNITY): Payer: Self-pay

## 2023-02-18 ENCOUNTER — Other Ambulatory Visit: Payer: Self-pay

## 2023-03-05 ENCOUNTER — Other Ambulatory Visit: Payer: Self-pay

## 2023-03-10 IMAGING — US US OB TRANSVAGINAL
1 series · 15 of 28 positions shown · non-contrast
Comparison: None

CLINICAL DATA: Beta HCG

EXAM:
OBSTETRIC <14 WK US AND TRANSVAGINAL OB US
TECHNIQUE: Both transabdominal and transvaginal ultrasound examinations were
performed for complete evaluation of the gestation as well as the
maternal uterus, adnexal regions, and pelvic cul-de-sac.
Transvaginal technique was performed to assess early pregnancy.

[Series 1: us ob comp less 14 wks mc & wl · 15 of 91 slices shown]
[im 1/91]
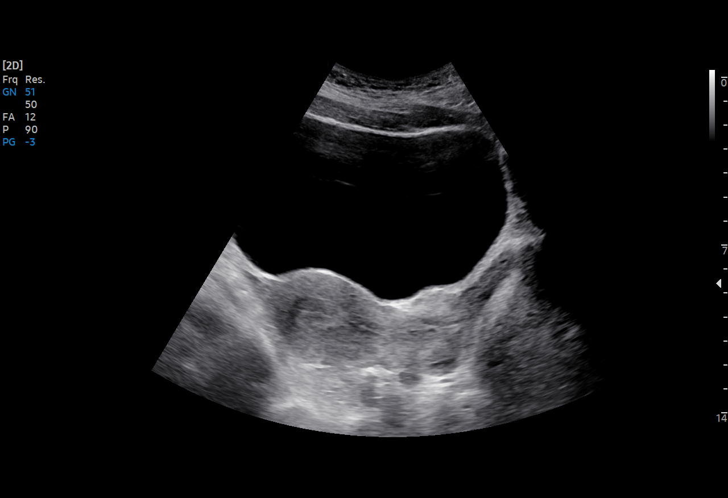
[im 7/91]
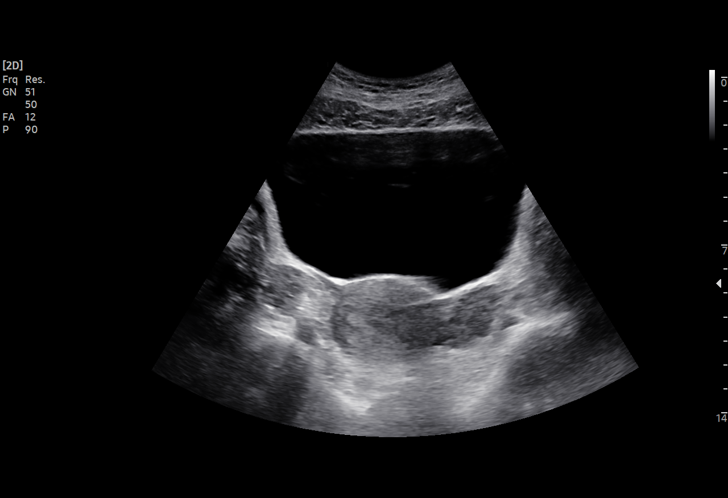
[im 14/91]
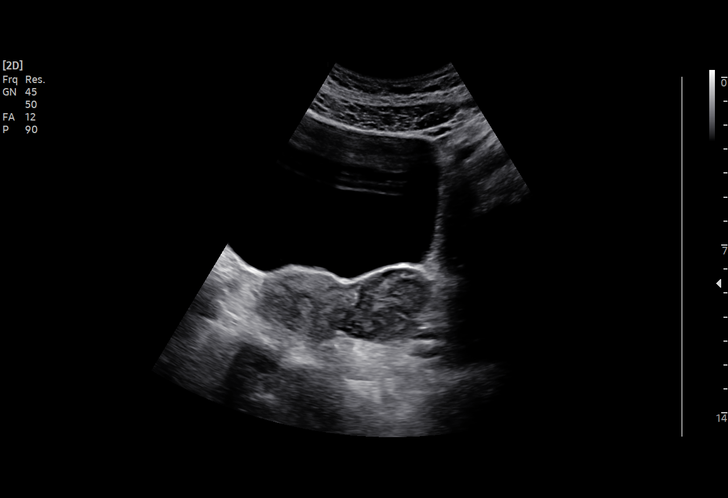
[im 21/91]
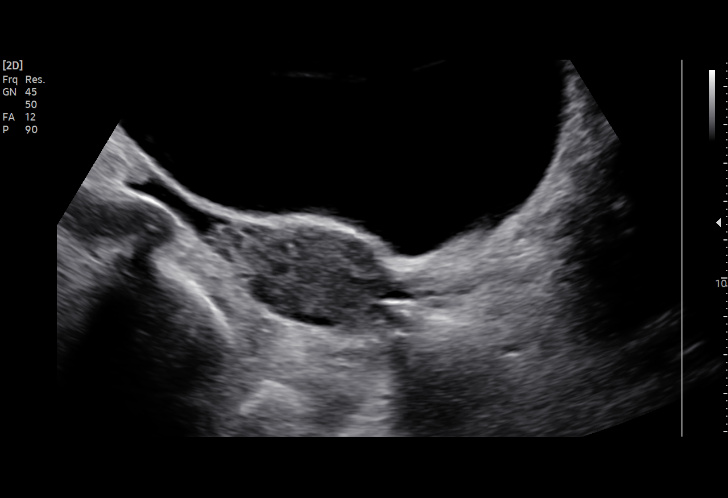
[im 27/91]
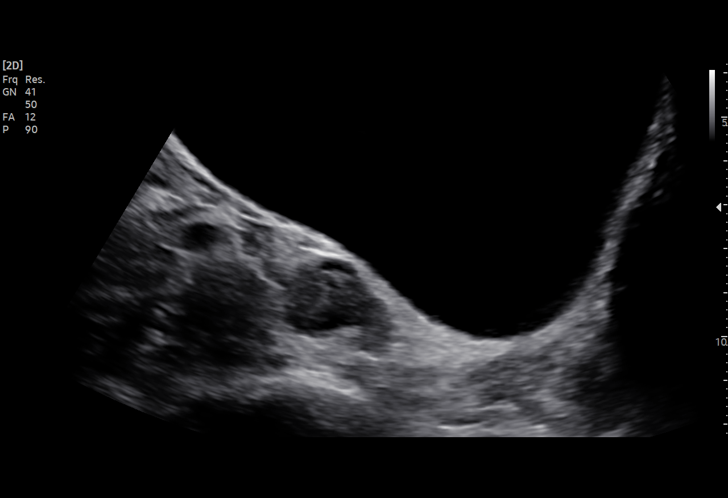
[im 34/91]
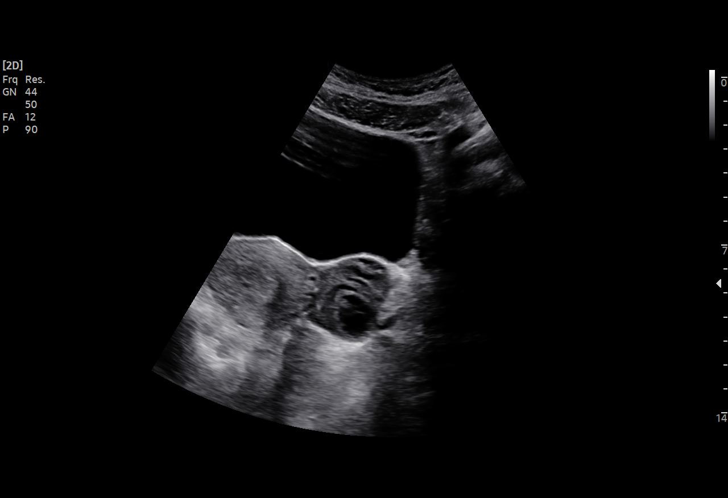
[im 41/91]
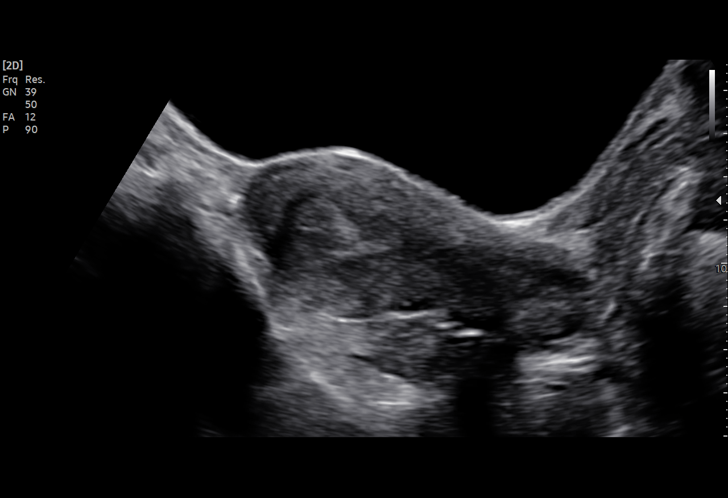
[im 47/91]
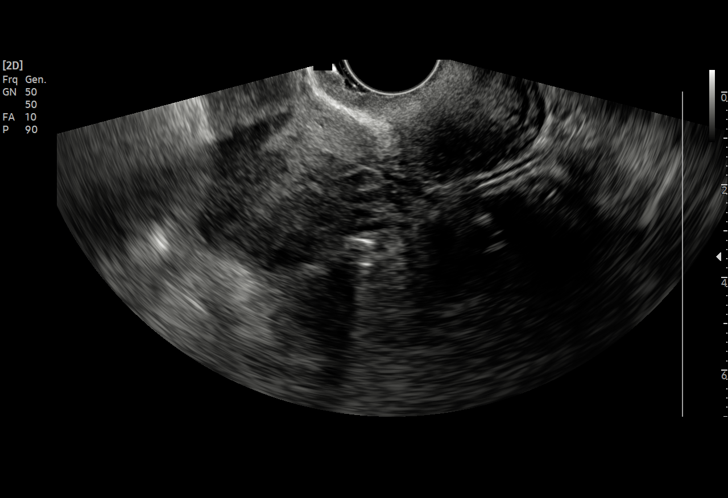
[im 51/91]
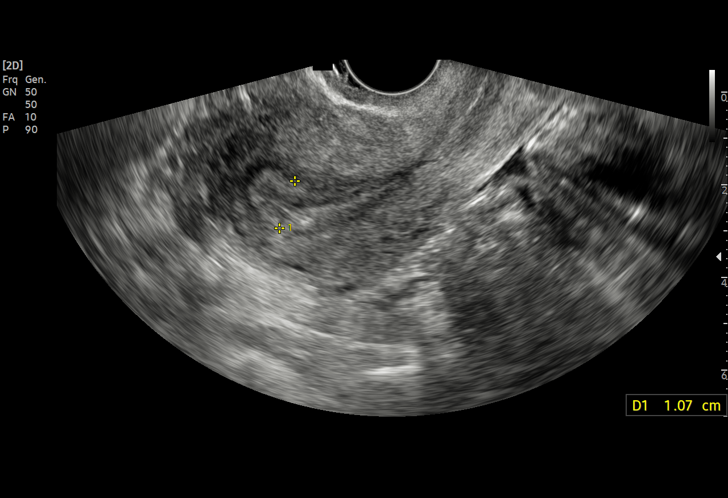
[im 57/91]
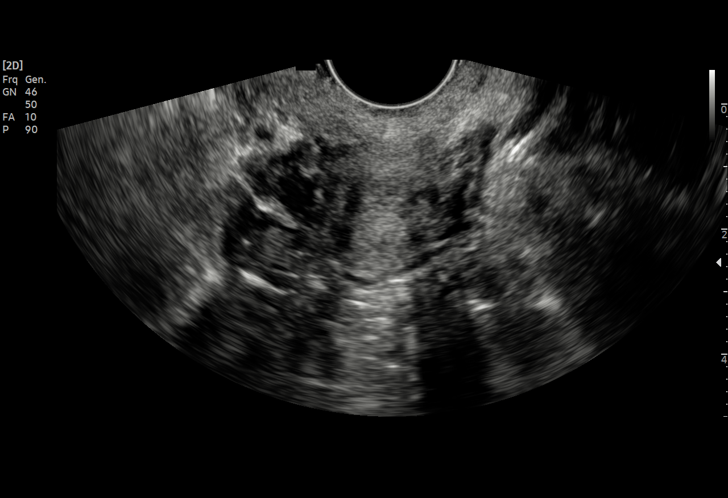
[im 64/91]
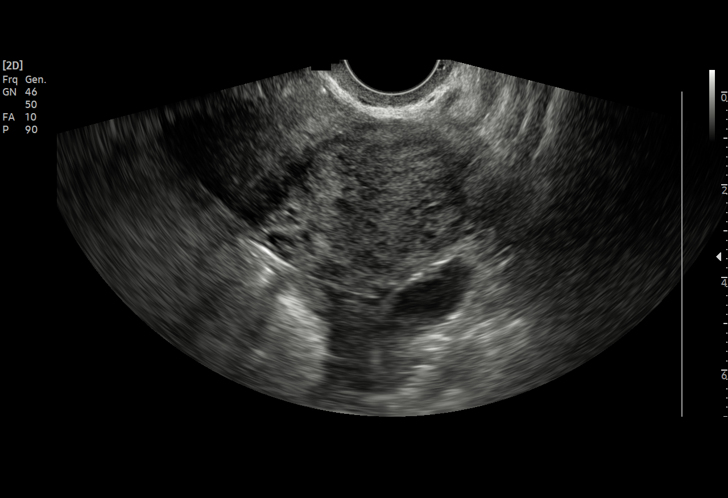
[im 71/91]
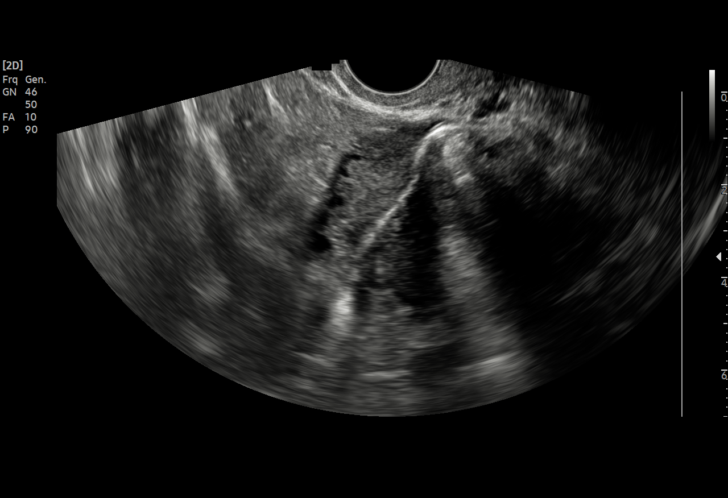
[im 77/91]
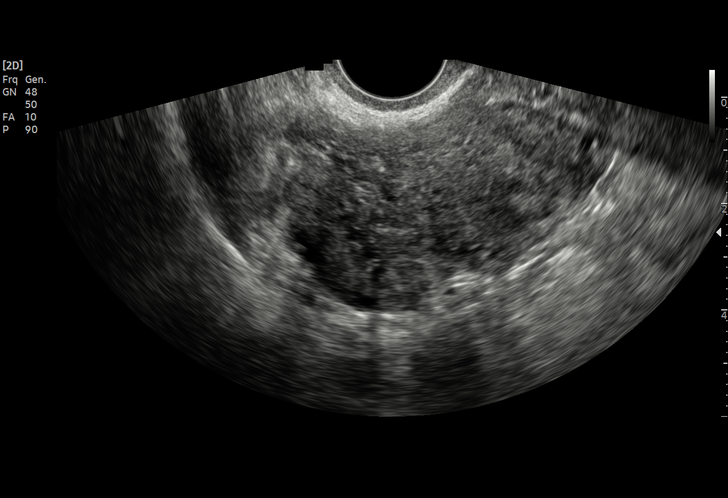
[im 84/91]
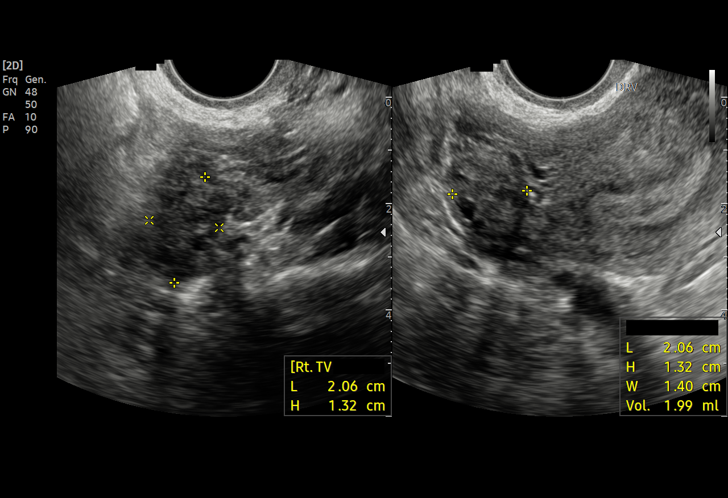
[im 91/91]
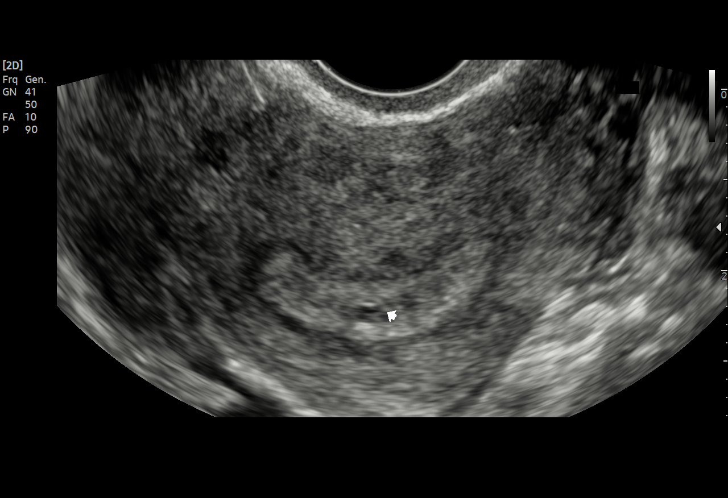

[15 of 28 positions shown; findings below may reference images not displayed]

FINDINGS: Intrauterine gestational sac: None

Yolk sac:  Not Visualized.

Embryo:  Not Visualized.

Cardiac Activity: Not Visualized.

Maternal uterus/adnexae: The uterus is unremarkable. Endometrium
measures 11 mm in thickness.

Left ovary measures 4.2 x 4.1 x 3.3 cm, with a 2.2 cm complex cystic
structure possibly representing a corpus luteum cyst. The right
ovary measures 2.1 x 1.3 x 1.4 cm. No free fluid.
IMPRESSION: 1. No evidence of intrauterine pregnancy at this time. Serial beta
HCG measurements and follow-up ultrasound will be needed to document
live IUP and exclude ectopic pregnancy.
2. 2.2 cm complex left ovarian cyst, suspect corpus luteum cyst.
This could be reassessed at the time of follow-up.

## 2023-03-10 IMAGING — US US OB COMP LESS 14 WK
1 series · 15 of 28 positions shown · non-contrast
Comparison: None

CLINICAL DATA: Beta HCG

EXAM:
OBSTETRIC <14 WK US AND TRANSVAGINAL OB US
TECHNIQUE: Both transabdominal and transvaginal ultrasound examinations were
performed for complete evaluation of the gestation as well as the
maternal uterus, adnexal regions, and pelvic cul-de-sac.
Transvaginal technique was performed to assess early pregnancy.

[Series 1: us ob comp less 14 wks mc & wl · 15 of 91 slices shown]
[im 1/91]
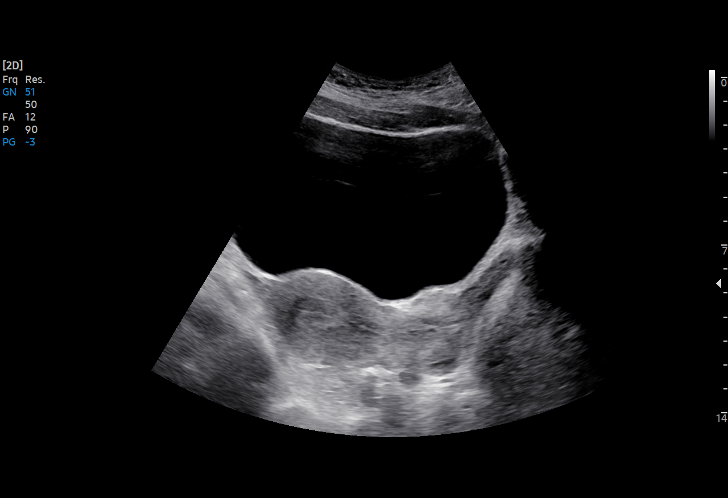
[im 7/91]
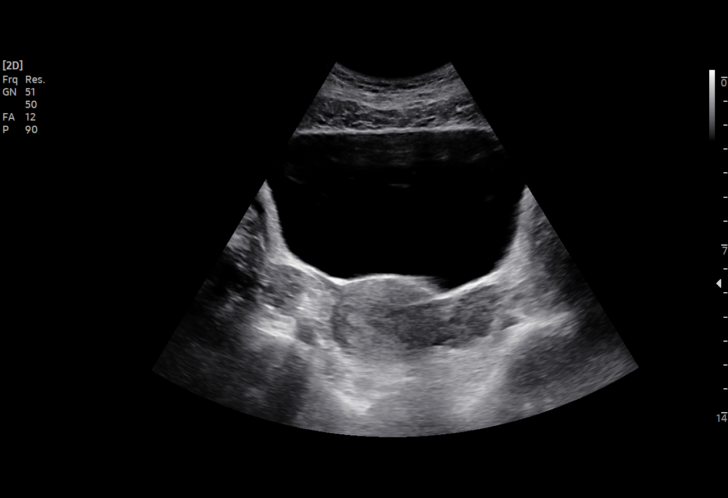
[im 14/91]
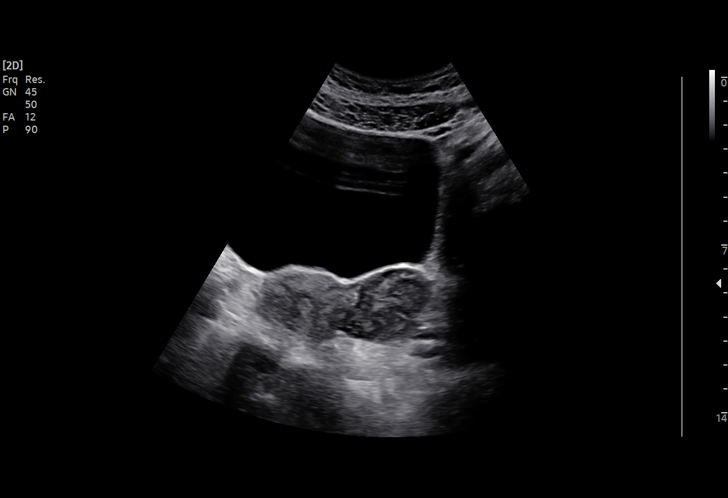
[im 21/91]
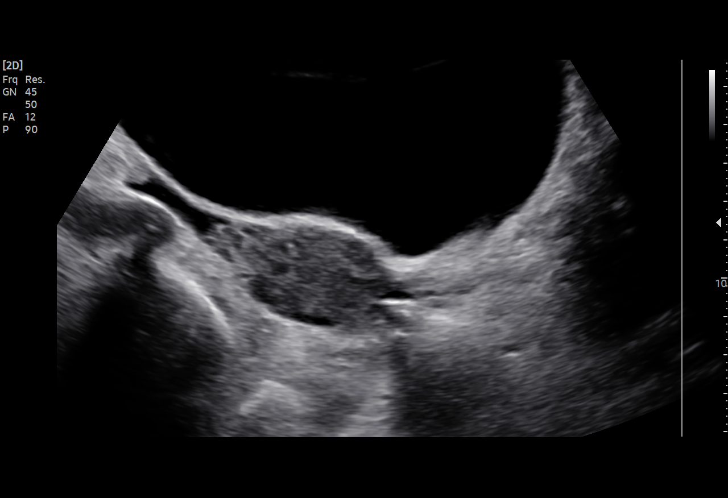
[im 27/91]
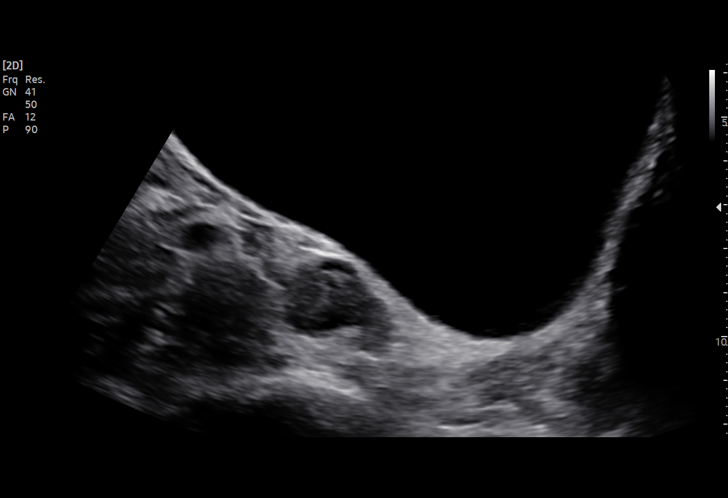
[im 34/91]
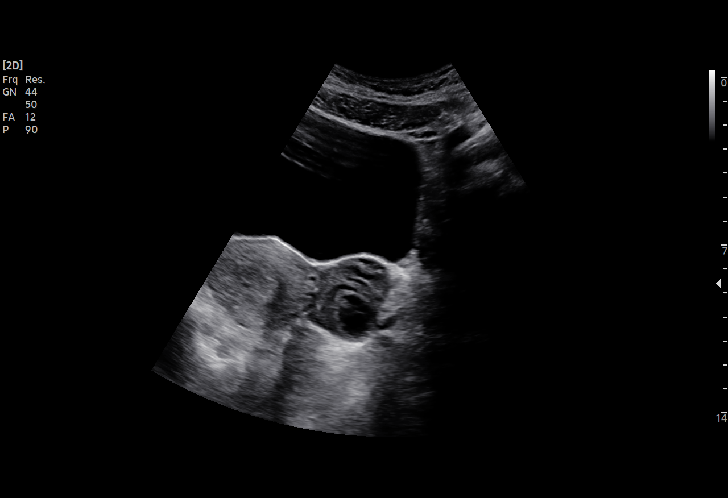
[im 41/91]
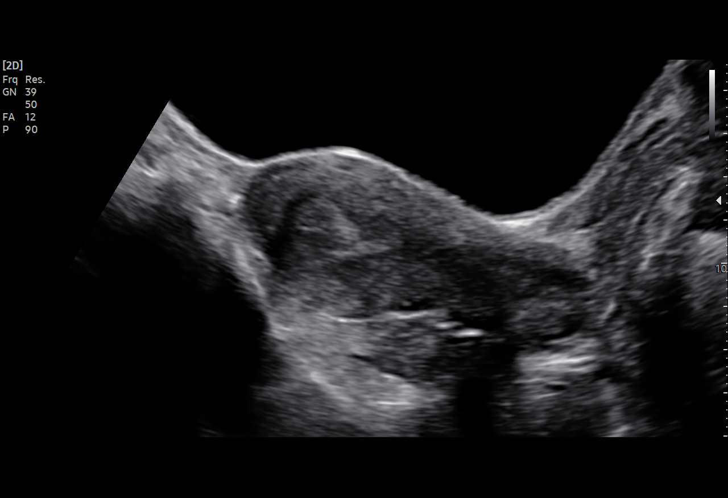
[im 47/91]
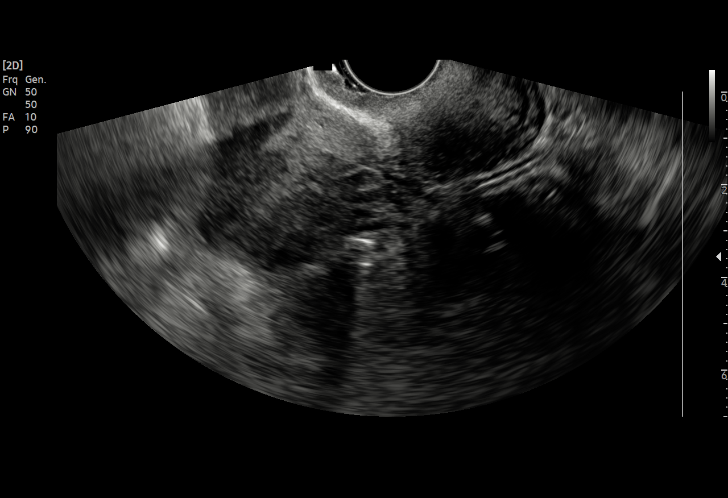
[im 51/91]
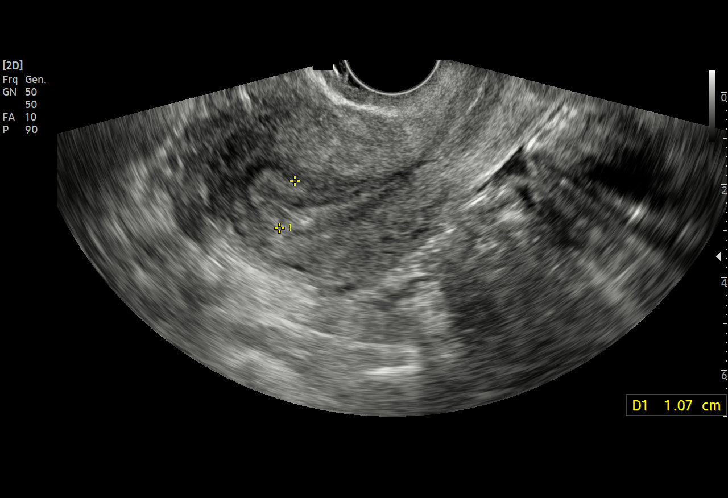
[im 57/91]
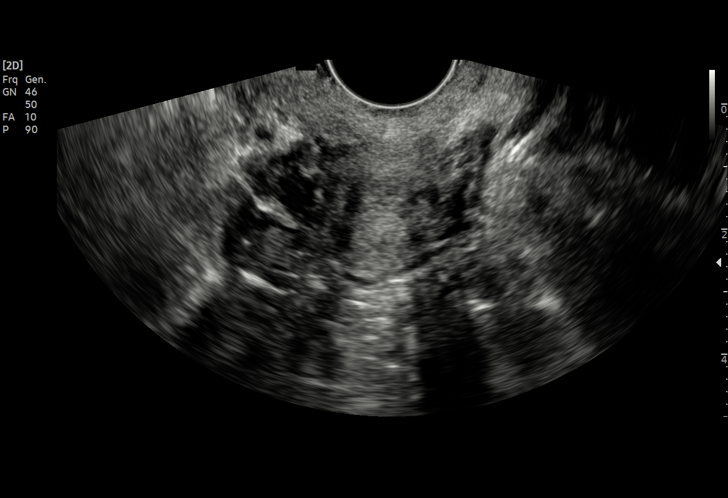
[im 64/91]
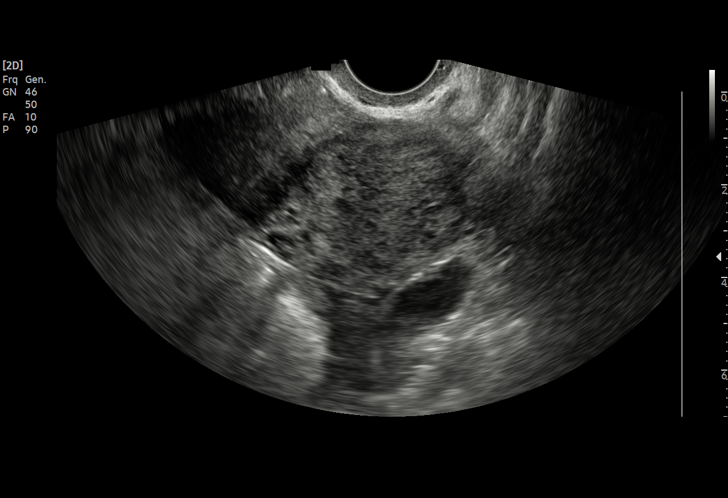
[im 71/91]
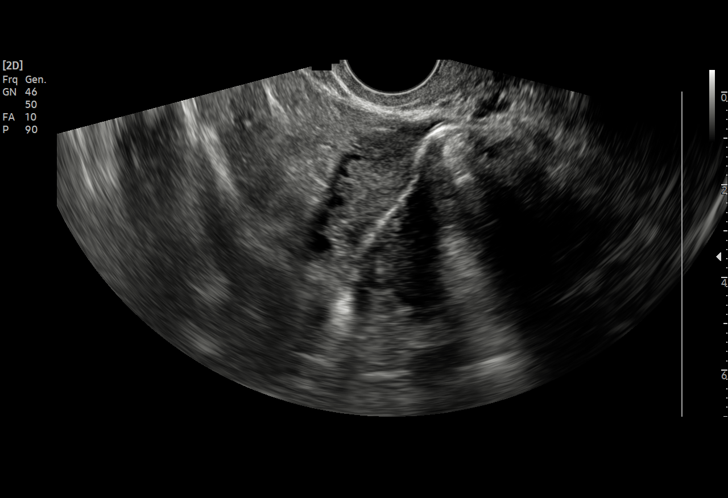
[im 77/91]
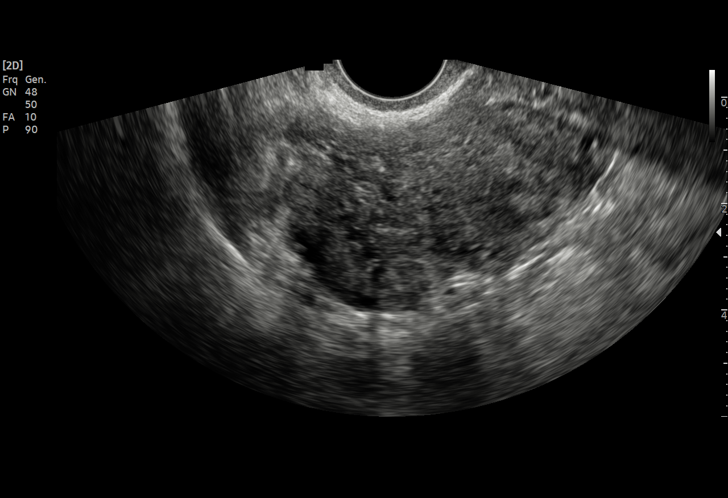
[im 84/91]
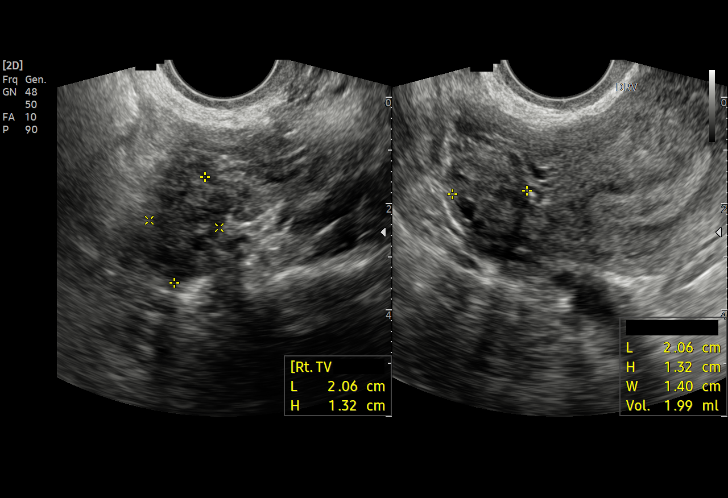
[im 91/91]
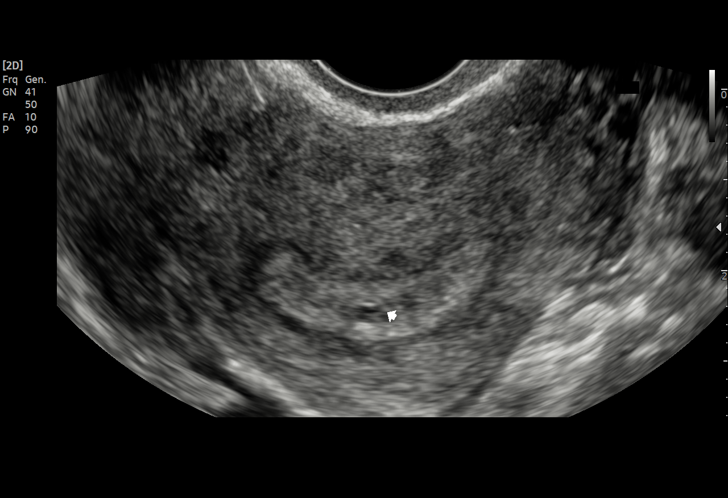

[15 of 28 positions shown; findings below may reference images not displayed]

FINDINGS: Intrauterine gestational sac: None

Yolk sac:  Not Visualized.

Embryo:  Not Visualized.

Cardiac Activity: Not Visualized.

Maternal uterus/adnexae: The uterus is unremarkable. Endometrium
measures 11 mm in thickness.

Left ovary measures 4.2 x 4.1 x 3.3 cm, with a 2.2 cm complex cystic
structure possibly representing a corpus luteum cyst. The right
ovary measures 2.1 x 1.3 x 1.4 cm. No free fluid.
IMPRESSION: 1. No evidence of intrauterine pregnancy at this time. Serial beta
HCG measurements and follow-up ultrasound will be needed to document
live IUP and exclude ectopic pregnancy.
2. 2.2 cm complex left ovarian cyst, suspect corpus luteum cyst.
This could be reassessed at the time of follow-up.

## 2023-03-25 ENCOUNTER — Other Ambulatory Visit (HOSPITAL_COMMUNITY): Payer: Self-pay

## 2023-03-25 MED ORDER — SULFAMETHOXAZOLE-TRIMETHOPRIM 800-160 MG PO TABS
1.0000 | ORAL_TABLET | Freq: Two times a day (BID) | ORAL | 0 refills | Status: AC
  Filled 2023-03-25: qty 14, 7d supply, fill #0

## 2023-04-01 ENCOUNTER — Other Ambulatory Visit: Payer: Self-pay

## 2023-04-06 ENCOUNTER — Other Ambulatory Visit (HOSPITAL_COMMUNITY): Payer: Self-pay

## 2023-04-24 ENCOUNTER — Other Ambulatory Visit (HOSPITAL_COMMUNITY): Payer: Self-pay

## 2023-04-26 ENCOUNTER — Other Ambulatory Visit (HOSPITAL_COMMUNITY): Payer: Self-pay

## 2023-04-30 ENCOUNTER — Other Ambulatory Visit (HOSPITAL_COMMUNITY): Payer: Self-pay

## 2023-05-27 ENCOUNTER — Other Ambulatory Visit (HOSPITAL_COMMUNITY): Payer: Self-pay

## 2023-05-28 ENCOUNTER — Other Ambulatory Visit: Payer: Self-pay

## 2023-05-28 ENCOUNTER — Other Ambulatory Visit (HOSPITAL_COMMUNITY): Payer: Self-pay

## 2023-05-28 MED ORDER — THYROID 60 MG PO TABS
60.0000 mg | ORAL_TABLET | Freq: Every day | ORAL | 0 refills | Status: AC
  Filled 2023-05-28: qty 30, 30d supply, fill #0

## 2023-06-14 ENCOUNTER — Other Ambulatory Visit (HOSPITAL_COMMUNITY): Payer: Self-pay

## 2023-06-14 MED ORDER — EPINEPHRINE 0.3 MG/0.3ML IJ SOAJ
INTRAMUSCULAR | 0 refills | Status: AC
  Filled 2023-06-14: qty 2, 7d supply, fill #0

## 2023-06-18 ENCOUNTER — Other Ambulatory Visit: Payer: Self-pay

## 2023-06-18 ENCOUNTER — Other Ambulatory Visit (HOSPITAL_COMMUNITY): Payer: Self-pay

## 2023-06-21 ENCOUNTER — Other Ambulatory Visit (HOSPITAL_COMMUNITY): Payer: Self-pay

## 2023-06-21 MED ORDER — THYROID 60 MG PO TABS
ORAL_TABLET | ORAL | 1 refills | Status: AC
  Filled 2023-06-21: qty 30, 30d supply, fill #0
  Filled 2023-07-19: qty 30, 30d supply, fill #1

## 2023-07-22 ENCOUNTER — Other Ambulatory Visit (HOSPITAL_COMMUNITY): Payer: Self-pay

## 2023-08-16 ENCOUNTER — Other Ambulatory Visit (HOSPITAL_COMMUNITY): Payer: Self-pay

## 2023-08-17 ENCOUNTER — Other Ambulatory Visit: Payer: Self-pay

## 2023-08-20 ENCOUNTER — Other Ambulatory Visit (HOSPITAL_COMMUNITY): Payer: Self-pay

## 2023-08-21 ENCOUNTER — Other Ambulatory Visit (HOSPITAL_COMMUNITY): Payer: Self-pay

## 2023-08-24 ENCOUNTER — Other Ambulatory Visit (HOSPITAL_COMMUNITY): Payer: Self-pay

## 2023-08-24 MED ORDER — THYROID 60 MG PO TABS
60.0000 mg | ORAL_TABLET | Freq: Every day | ORAL | 0 refills | Status: AC
  Filled 2023-08-24: qty 30, 30d supply, fill #0

## 2023-08-27 ENCOUNTER — Other Ambulatory Visit (HOSPITAL_COMMUNITY): Payer: Self-pay

## 2023-08-31 ENCOUNTER — Other Ambulatory Visit (HOSPITAL_COMMUNITY): Payer: Self-pay

## 2023-08-31 MED ORDER — DROSPIREN-ETH ESTRAD-LEVOMEFOL 3-0.03-0.451 MG PO TABS
1.0000 | ORAL_TABLET | Freq: Every day | ORAL | 4 refills | Status: AC
  Filled 2023-08-31 – 2023-09-09 (×2): qty 28, 28d supply, fill #0
  Filled 2023-11-02: qty 28, 28d supply, fill #1
  Filled 2024-01-23: qty 28, 28d supply, fill #2
  Filled 2024-02-18: qty 28, 28d supply, fill #3
  Filled 2024-03-17: qty 28, 28d supply, fill #4
  Filled 2024-04-14: qty 28, 28d supply, fill #5
  Filled 2024-05-17 – 2024-05-19 (×2): qty 28, 28d supply, fill #6

## 2023-09-01 ENCOUNTER — Other Ambulatory Visit (HOSPITAL_COMMUNITY): Payer: Self-pay

## 2023-09-02 ENCOUNTER — Other Ambulatory Visit (HOSPITAL_COMMUNITY): Payer: Self-pay

## 2023-09-09 ENCOUNTER — Other Ambulatory Visit (HOSPITAL_COMMUNITY): Payer: Self-pay

## 2023-09-14 ENCOUNTER — Other Ambulatory Visit (HOSPITAL_COMMUNITY): Payer: Self-pay

## 2023-09-14 MED ORDER — AMPHETAMINE-DEXTROAMPHET ER 15 MG PO CP24
15.0000 mg | ORAL_CAPSULE | Freq: Every day | ORAL | 0 refills | Status: DC
  Filled 2023-09-14: qty 25, 25d supply, fill #0
  Filled 2023-09-14: qty 5, 5d supply, fill #0

## 2023-09-14 MED ORDER — THYROID 60 MG PO TABS
60.0000 mg | ORAL_TABLET | Freq: Every day | ORAL | 3 refills | Status: AC
  Filled 2023-09-14: qty 90, 90d supply, fill #0
  Filled 2023-09-23: qty 30, 30d supply, fill #0
  Filled 2023-10-24: qty 30, 30d supply, fill #1
  Filled 2023-11-26: qty 30, 30d supply, fill #2
  Filled 2023-12-23: qty 30, 30d supply, fill #3
  Filled 2024-01-23: qty 30, 30d supply, fill #4
  Filled 2024-02-18: qty 30, 30d supply, fill #5
  Filled 2024-03-17: qty 30, 30d supply, fill #6
  Filled 2024-04-14: qty 30, 30d supply, fill #7
  Filled 2024-05-17: qty 30, 30d supply, fill #8

## 2023-09-17 ENCOUNTER — Other Ambulatory Visit (HOSPITAL_COMMUNITY): Payer: Self-pay

## 2023-09-17 MED ORDER — DROSPIREN-ETH ESTRAD-LEVOMEFOL 3-0.03-0.451 MG PO TABS
1.0000 | ORAL_TABLET | Freq: Every day | ORAL | 4 refills | Status: AC
  Filled 2023-09-17: qty 84, 84d supply, fill #0
  Filled 2023-10-02: qty 28, 28d supply, fill #0
  Filled 2023-11-26: qty 28, 28d supply, fill #1
  Filled 2023-12-23: qty 28, 28d supply, fill #2

## 2023-09-18 ENCOUNTER — Other Ambulatory Visit (HOSPITAL_COMMUNITY): Payer: Self-pay

## 2023-09-23 ENCOUNTER — Other Ambulatory Visit (HOSPITAL_COMMUNITY): Payer: Self-pay

## 2023-09-23 ENCOUNTER — Other Ambulatory Visit: Payer: Self-pay

## 2023-09-29 ENCOUNTER — Other Ambulatory Visit (HOSPITAL_COMMUNITY): Payer: Self-pay

## 2023-10-02 ENCOUNTER — Other Ambulatory Visit (HOSPITAL_COMMUNITY): Payer: Self-pay

## 2023-10-07 ENCOUNTER — Other Ambulatory Visit (HOSPITAL_COMMUNITY): Payer: Self-pay

## 2023-10-12 ENCOUNTER — Other Ambulatory Visit (HOSPITAL_COMMUNITY): Payer: Self-pay

## 2023-10-24 ENCOUNTER — Other Ambulatory Visit (HOSPITAL_COMMUNITY): Payer: Self-pay

## 2023-10-29 ENCOUNTER — Other Ambulatory Visit (HOSPITAL_COMMUNITY): Payer: Self-pay

## 2023-11-02 ENCOUNTER — Other Ambulatory Visit (HOSPITAL_COMMUNITY): Payer: Self-pay

## 2023-11-04 ENCOUNTER — Other Ambulatory Visit (HOSPITAL_COMMUNITY): Payer: Self-pay

## 2023-11-11 ENCOUNTER — Other Ambulatory Visit (HOSPITAL_COMMUNITY): Payer: Self-pay

## 2023-11-11 MED ORDER — AMPHETAMINE-DEXTROAMPHET ER 15 MG PO CP24
15.0000 mg | ORAL_CAPSULE | Freq: Every day | ORAL | 0 refills | Status: AC
  Filled 2023-11-11 – 2023-11-26 (×2): qty 30, 30d supply, fill #0

## 2023-11-20 ENCOUNTER — Other Ambulatory Visit (HOSPITAL_COMMUNITY): Payer: Self-pay

## 2023-11-26 ENCOUNTER — Other Ambulatory Visit (HOSPITAL_COMMUNITY): Payer: Self-pay

## 2023-11-29 ENCOUNTER — Other Ambulatory Visit (HOSPITAL_COMMUNITY): Payer: Self-pay

## 2023-12-24 ENCOUNTER — Other Ambulatory Visit (HOSPITAL_COMMUNITY): Payer: Self-pay

## 2023-12-24 ENCOUNTER — Other Ambulatory Visit: Payer: Self-pay

## 2024-01-24 ENCOUNTER — Other Ambulatory Visit: Payer: Self-pay

## 2024-01-24 ENCOUNTER — Other Ambulatory Visit (HOSPITAL_COMMUNITY): Payer: Self-pay

## 2024-02-19 ENCOUNTER — Other Ambulatory Visit (HOSPITAL_COMMUNITY): Payer: Self-pay

## 2024-02-21 ENCOUNTER — Other Ambulatory Visit (HOSPITAL_COMMUNITY): Payer: Self-pay

## 2024-02-21 ENCOUNTER — Other Ambulatory Visit: Payer: Self-pay

## 2024-02-22 ENCOUNTER — Other Ambulatory Visit (HOSPITAL_COMMUNITY): Payer: Self-pay

## 2024-03-17 ENCOUNTER — Other Ambulatory Visit (HOSPITAL_COMMUNITY): Payer: Self-pay

## 2024-03-17 ENCOUNTER — Other Ambulatory Visit: Payer: Self-pay

## 2024-04-14 ENCOUNTER — Other Ambulatory Visit (HOSPITAL_COMMUNITY): Payer: Self-pay

## 2024-04-14 ENCOUNTER — Other Ambulatory Visit: Payer: Self-pay

## 2024-05-17 ENCOUNTER — Other Ambulatory Visit (HOSPITAL_COMMUNITY): Payer: Self-pay

## 2024-05-18 ENCOUNTER — Encounter (HOSPITAL_COMMUNITY): Payer: Self-pay

## 2024-05-18 ENCOUNTER — Other Ambulatory Visit (HOSPITAL_COMMUNITY): Payer: Self-pay

## 2024-05-18 ENCOUNTER — Other Ambulatory Visit: Payer: Self-pay

## 2024-05-19 ENCOUNTER — Other Ambulatory Visit: Payer: Self-pay

## 2024-05-19 ENCOUNTER — Other Ambulatory Visit (HOSPITAL_COMMUNITY): Payer: Self-pay

## 2024-05-22 ENCOUNTER — Other Ambulatory Visit (HOSPITAL_COMMUNITY): Payer: Self-pay

## 2024-05-23 ENCOUNTER — Other Ambulatory Visit (HOSPITAL_COMMUNITY): Payer: Self-pay

## 2024-05-23 MED ORDER — NORGESTIMATE-ETH ESTRADIOL 0.25-35 MG-MCG PO TABS
1.0000 | ORAL_TABLET | Freq: Every day | ORAL | 1 refills | Status: DC
  Filled 2024-05-23: qty 28, 28d supply, fill #0

## 2024-10-09 ENCOUNTER — Emergency Department (HOSPITAL_BASED_OUTPATIENT_CLINIC_OR_DEPARTMENT_OTHER)
Admission: EM | Admit: 2024-10-09 | Discharge: 2024-10-09 | Disposition: A | Discharge status: Home / Self Care | Attending: Emergency Medicine | Admitting: Emergency Medicine

## 2024-10-09 ENCOUNTER — Encounter (HOSPITAL_BASED_OUTPATIENT_CLINIC_OR_DEPARTMENT_OTHER): Payer: Self-pay

## 2024-10-09 ENCOUNTER — Emergency Department (HOSPITAL_BASED_OUTPATIENT_CLINIC_OR_DEPARTMENT_OTHER)

## 2024-10-09 ENCOUNTER — Other Ambulatory Visit: Payer: Self-pay

## 2024-10-09 DIAGNOSIS — N132 Hydronephrosis with renal and ureteral calculous obstruction: Secondary | ICD-10-CM | POA: Insufficient documentation

## 2024-10-09 DIAGNOSIS — Z91010 Allergy to peanuts: Secondary | ICD-10-CM | POA: Diagnosis not present

## 2024-10-09 DIAGNOSIS — N201 Calculus of ureter: Secondary | ICD-10-CM

## 2024-10-09 DIAGNOSIS — D72829 Elevated white blood cell count, unspecified: Secondary | ICD-10-CM | POA: Insufficient documentation

## 2024-10-09 DIAGNOSIS — R10A1 Flank pain, right side: Secondary | ICD-10-CM | POA: Diagnosis present

## 2024-10-09 LAB — CBC WITH DIFFERENTIAL/PLATELET
Abs Immature Granulocytes: 0.04 K/uL (ref 0.00–0.07)
Basophils Absolute: 0.1 K/uL (ref 0.0–0.1)
Basophils Relative: 0 %
Eosinophils Absolute: 0 K/uL (ref 0.0–0.5)
Eosinophils Relative: 0 %
HCT: 37.8 % (ref 36.0–46.0)
Hemoglobin: 13.1 g/dL (ref 12.0–15.0)
Immature Granulocytes: 0 %
Lymphocytes Relative: 14 %
Lymphs Abs: 1.7 K/uL (ref 0.7–4.0)
MCH: 31.3 pg (ref 26.0–34.0)
MCHC: 34.7 g/dL (ref 30.0–36.0)
MCV: 90.2 fL (ref 80.0–100.0)
Monocytes Absolute: 0.4 K/uL (ref 0.1–1.0)
Monocytes Relative: 3 %
Neutro Abs: 10.1 K/uL — ABNORMAL HIGH (ref 1.7–7.7)
Neutrophils Relative %: 83 %
Platelets: 278 K/uL (ref 150–400)
RBC: 4.19 MIL/uL (ref 3.87–5.11)
RDW: 12 % (ref 11.5–15.5)
WBC: 12.3 K/uL — ABNORMAL HIGH (ref 4.0–10.5)
nRBC: 0 % (ref 0.0–0.2)

## 2024-10-09 LAB — COMPREHENSIVE METABOLIC PANEL WITH GFR
ALT: 18 U/L (ref 0–44)
AST: 18 U/L (ref 15–41)
Albumin: 4.6 g/dL (ref 3.5–5.0)
Alkaline Phosphatase: 35 U/L — ABNORMAL LOW (ref 38–126)
Anion gap: 11 (ref 5–15)
BUN: 16 mg/dL (ref 6–20)
CO2: 23 mmol/L (ref 22–32)
Calcium: 9.3 mg/dL (ref 8.9–10.3)
Chloride: 102 mmol/L (ref 98–111)
Creatinine, Ser: 0.87 mg/dL (ref 0.44–1.00)
GFR, Estimated: >60 mL/min (ref >60)
Glucose, Bld: 121 mg/dL — ABNORMAL HIGH (ref 70–99)
Potassium: 3.9 mmol/L (ref 3.5–5.1)
Sodium: 137 mmol/L (ref 135–145)
Total Bilirubin: 1.4 mg/dL — ABNORMAL HIGH (ref 0.0–1.2)
Total Protein: 7.3 g/dL (ref 6.5–8.1)

## 2024-10-09 LAB — HCG, SERUM, QUALITATIVE: Preg, Serum: NEGATIVE

## 2024-10-09 LAB — URINALYSIS, ROUTINE W REFLEX MICROSCOPIC
Bilirubin Urine: NEGATIVE
Glucose, UA: NEGATIVE mg/dL
Ketones, ur: NEGATIVE mg/dL
Leukocytes,Ua: NEGATIVE
Nitrite: NEGATIVE
Protein, ur: 30 mg/dL — AB
Specific Gravity, Urine: >=1.03 (ref 1.005–1.030)
pH: 6.5 (ref 5.0–8.0)

## 2024-10-09 LAB — PREGNANCY, URINE: Preg Test, Ur: NEGATIVE

## 2024-10-09 LAB — URINALYSIS, MICROSCOPIC (REFLEX): WBC, UA: NONE SEEN WBC/hpf (ref 0–5)

## 2024-10-09 LAB — LIPASE, BLOOD: Lipase: 26 U/L (ref 11–51)

## 2024-10-09 MED ORDER — ONDANSETRON HCL 4 MG/2ML IJ SOLN
4.0000 mg | Freq: Once | INTRAMUSCULAR | Status: AC
  Administered 2024-10-09: 4 mg via INTRAVENOUS
  Filled 2024-10-09: qty 2

## 2024-10-09 MED ORDER — SODIUM CHLORIDE 0.9 % IV SOLN
1.0000 g | Freq: Once | INTRAVENOUS | Status: AC
  Administered 2024-10-09: 1 g via INTRAVENOUS
  Filled 2024-10-09: qty 10

## 2024-10-09 MED ORDER — FENTANYL CITRATE (PF) 50 MCG/ML IJ SOSY
50.0000 ug | PREFILLED_SYRINGE | Freq: Once | INTRAMUSCULAR | Status: AC
  Administered 2024-10-09: 50 ug via INTRAVENOUS
  Filled 2024-10-09: qty 1

## 2024-10-09 MED ORDER — ACETAMINOPHEN 500 MG PO TABS
1000.0000 mg | ORAL_TABLET | Freq: Once | ORAL | Status: DC
  Filled 2024-10-09: qty 2

## 2024-10-09 MED ORDER — HYDROMORPHONE HCL 1 MG/ML IJ SOLN
1.0000 mg | Freq: Once | INTRAMUSCULAR | Status: AC
  Administered 2024-10-09: 1 mg via INTRAVENOUS
  Filled 2024-10-09: qty 1

## 2024-10-09 MED ORDER — TAMSULOSIN HCL 0.4 MG PO CAPS
0.4000 mg | ORAL_CAPSULE | Freq: Once | ORAL | Status: AC
  Administered 2024-10-09: 0.4 mg via ORAL
  Filled 2024-10-09: qty 1

## 2024-10-09 MED ORDER — OXYCODONE-ACETAMINOPHEN 5-325 MG PO TABS: 1.0000 | ORAL_TABLET | ORAL | Status: DC | PRN

## 2024-10-09 MED ORDER — KETOROLAC TROMETHAMINE 10 MG PO TABS: 10.0000 mg | ORAL_TABLET | Freq: Four times a day (QID) | ORAL | 0 refills | Status: AC | PRN

## 2024-10-09 MED ORDER — HYDROCODONE-ACETAMINOPHEN 5-325 MG PO TABS
1.0000 | ORAL_TABLET | Freq: Four times a day (QID) | ORAL | 0 refills | Status: AC | PRN
Start: 2024-10-09 — End: ?

## 2024-10-09 MED ORDER — CEPHALEXIN 500 MG PO CAPS: 500.0000 mg | ORAL_CAPSULE | Freq: Two times a day (BID) | ORAL | 0 refills | Status: AC

## 2024-10-09 MED ORDER — KETOROLAC TROMETHAMINE 15 MG/ML IJ SOLN
15.0000 mg | Freq: Once | INTRAMUSCULAR | Status: AC
  Administered 2024-10-09: 15 mg via INTRAVENOUS
  Filled 2024-10-09: qty 1

## 2024-10-09 MED ORDER — TAMSULOSIN HCL 0.4 MG PO CAPS: 0.4000 mg | ORAL_CAPSULE | Freq: Every day | ORAL | 0 refills | Status: AC

## 2024-10-09 MED ORDER — FENTANYL CITRATE (PF) 50 MCG/ML IJ SOSY: 50.0000 ug | PREFILLED_SYRINGE | INTRAMUSCULAR | Status: DC | PRN

## 2024-10-09 MED ORDER — HYDROCODONE-ACETAMINOPHEN 5-325 MG PO TABS
1.0000 | ORAL_TABLET | Freq: Once | ORAL | Status: AC
  Administered 2024-10-09: 1 via ORAL
  Filled 2024-10-09: qty 1

## 2024-10-09 NOTE — Discharge Instructions (Addendum)
 Your CT imaging revealed evidence of a kidney stone that was lodged at the junction between your ureter and your bladder.  Your pain improved with pain medication.  Take Flomax and continue to take pain medication outpatient and follow-up with urology.  Will discharge you on an antibiotic as well.  IMPRESSION:  1. Mild right hydronephrosis and hydroureter secondary to an obstructing 4 mm  calculus at the ureterovesicular junction  2. Additional 1 mm punctate nonobstructing calculus within the lower right  kidney

## 2024-10-09 NOTE — ED Provider Notes (Signed)
 [facility] Provider Note   CSN: [id number] Arrival date & time: 10/09/24  8195     Patient presents with: Flank Pain   Natalie Kramer is a 28 y.o. female.    Flank Pain     28 year old female with medical history significant for nephrolithiasis presenting to the emergency department with acute onset right-sided flank pain with associated nausea and vomiting since this afternoon.  Denies any fevers, chills, denies any dysuria or hematuria.  Patient states that she has a history of kidney stones and symptoms feel similar.  She has been able to keep anything down, endorses 10 out of 10 pain in the right flank.  Prior to Admission medications   Medication Sig Start Date End Date Taking? Authorizing Provider  cephALEXin (KEFLEX) 500 MG capsule Take 1 capsule (500 mg total) by mouth 2 (two) times daily for 5 days. 10/09/24 10/14/24 Yes Jerrol Agent, MD  HYDROcodone-acetaminophen  (NORCO/VICODIN) 5-325 MG tablet Take 1-2 tablets by mouth every 6 (six) hours as needed. 10/09/24  Yes Jerrol Agent, MD  ketorolac (TORADOL) 10 MG tablet Take 1 tablet (10 mg total) by mouth every 6 (six) hours as needed. 10/09/24  Yes Jerrol Agent, MD  tamsulosin (FLOMAX) 0.4 MG CAPS capsule Take 1 capsule (0.4 mg total) by mouth daily for 5 days. 10/09/24 10/14/24 Yes Jerrol Agent, MD  acetaminophen  (TYLENOL ) 325 MG tablet Take 2 tablets (650 mg total) by mouth every 4 (four) hours as needed (for pain scale < 4). 12/23/22   Lequita Evalene LABOR, MD  amphetamine -dextroamphetamine  (ADDERALL XR) 15 MG 24 hr capsule Take 1 capsule by mouth daily. 11/11/23     cholecalciferol (VITAMIN D3) 25 MCG (1000 UNIT) tablet Take by mouth daily.    [provider]  Drospirenone -Ethinyl Estradiol -Levomefol (SAFYRAL ) 3-0.03-0.451 MG tablet Take 1 tablet by mouth daily as directed. 08/31/23     Drospirenone -Ethinyl Estradiol -Levomefol (SAFYRAL ) 3-0.03-0.451 MG tablet Take 1  tablet by mouth daily. 09/17/23     EPINEPHrine  0.3 mg/0.3 mL IJ SOAJ injection Inject into the muscle every 5 to 15 minutes as needed for hypersensitivity reaction; do not exceed 3 doses per episode 06/14/23     folic acid (FOLVITE) 1 MG tablet Take 1 mg by mouth daily.    [provider]  ibuprofen  (ADVIL ) 600 MG tablet Take 1 tablet (600 mg total) by mouth every 6 (six) hours. 12/23/22   Lequita Evalene LABOR, MD  norethindrone  (CAMILA ) 0.35 MG tablet Take 1 tablet (0.35 mg total) by mouth daily. 02/02/23     norgestimate -ethinyl estradiol  (SPRINTEC  28) 0.25-35 MG-MCG tablet Take 1 tablet by mouth daily. 05/23/24     Prenatal Vit-Fe Fumarate-FA (MULTIVITAMIN-PRENATAL) 27-0.8 MG TABS tablet Take 1 tablet by mouth daily at 12 noon.    [provider]  sulfamethoxazole -trimethoprim  (BACTRIM  DS) 800-160 MG tablet Take 1 tablet by mouth 2 (two) times daily with food 03/25/23     thyroid  (ARMOUR THYROID ) 60 MG tablet Take 1 tablet (60 mg total) by mouth daily on an empty stomach 05/28/23     thyroid  (ARMOUR THYROID ) 60 MG tablet Take 1 tablet by mouth once a day on an empty stomach 06/21/23     thyroid  (ARMOUR THYROID ) 60 MG tablet Take 1 tablet (60 mg total) by mouth daily on an empty stomach 08/24/23     thyroid  (ARMOUR THYROID ) 60 MG tablet Take 1 tablet (60 mg total) by mouth daily. 09/14/23       Allergies: Peanut-containing drug  products and Amoxicillin    Review of Systems  Genitourinary:  Positive for flank pain.  All other systems reviewed and are negative.   Updated Vital Signs BP 120/75   Pulse 93   Temp 97.6 F (36.4 C) (Oral)   Resp (!) 22   LMP 10/02/2024 (Approximate)   SpO2 100%   Physical Exam Vitals and nursing note reviewed.  Constitutional:      General: She is not in acute distress.    Appearance: She is well-developed.  HENT:     Head: Normocephalic and atraumatic.  Eyes:     Conjunctiva/sclera: Conjunctivae normal.  Cardiovascular:     Rate and Rhythm:  Normal rate and regular rhythm.     Heart sounds: No murmur heard. Pulmonary:     Effort: Pulmonary effort is normal. No respiratory distress.     Breath sounds: Normal breath sounds.  Abdominal:     Palpations: Abdomen is soft.     Tenderness: There is abdominal tenderness. There is right CVA tenderness. There is no left CVA tenderness.     Comments: Right sided abdominal tenderness to palpation, guarding present  Musculoskeletal:        General: No swelling.     Cervical back: Neck supple.  Skin:    General: Skin is warm and dry.     Capillary Refill: Capillary refill takes less than 2 seconds.  Neurological:     Mental Status: She is alert.  Psychiatric:        Mood and Affect: Mood normal.     (all labs ordered are listed, but only abnormal results are displayed) Labs Reviewed  URINALYSIS, ROUTINE W REFLEX MICROSCOPIC - Abnormal; Notable for the following components:      Result Value   Hgb urine dipstick SMALL (*)    Protein, ur 30 (*)    All other components within normal limits  CBC WITH DIFFERENTIAL/PLATELET - Abnormal; Notable for the following components:   WBC 12.3 (*)    Neutro Abs 10.1 (*)    All other components within normal limits  COMPREHENSIVE METABOLIC PANEL WITH GFR - Abnormal; Notable for the following components:   Glucose, Bld 121 (*)    Alkaline Phosphatase 35 (*)    Total Bilirubin 1.4 (*)    All other components within normal limits  URINALYSIS, MICROSCOPIC (REFLEX) - Abnormal; Notable for the following components:   Bacteria, UA RARE (*)    All other components within normal limits  PREGNANCY, URINE  LIPASE, BLOOD  HCG, SERUM, QUALITATIVE    EKG: None  Radiology: CT Renal Stone Study Result Date: 10/09/2024 EXAM: CT ABDOMEN AND PELVIS WITHOUT CONTRAST 10/09/2024 08:20:49 PM TECHNIQUE: CT of the abdomen and pelvis was performed without the administration of intravenous contrast. Multiplanar reformatted images are provided for review.  Automated exposure control, iterative reconstruction, and/or weight-based adjustment of the mA/kV was utilized to reduce the radiation dose to as low as reasonably achievable. COMPARISON: None available. CLINICAL HISTORY: Abdominal/flank pain, stone suspected. FINDINGS: LOWER CHEST: No acute abnormality. LIVER: The liver is unremarkable. GALLBLADDER AND BILE DUCTS: Gallbladder is unremarkable. No biliary ductal dilatation. SPLEEN: No acute abnormality. PANCREAS: No acute abnormality. ADRENAL GLANDS: No acute abnormality. KIDNEYS, URETERS AND BLADDER: The kidneys are normal in size and position. Mild right hydronephrosis and hydroureter to the level of the ureterovesicular junction where an obstructing 4 mm calculus is noted. Additional 1 mm punctate nonobstructing calculus within the lower pole of the right kidney. No additional renal or ureteral  calculi. No hydronephrosis on the left. No perinephric fluid collections. The bladder is otherwise unremarkable. GI AND BOWEL: Appendix normal. The stomach, small bowel, and large bowel are otherwise unremarkable. There is no bowel obstruction. PERITONEUM AND RETROPERITONEUM: No ascites. No free air. VASCULATURE: Aorta is normal in caliber. LYMPH NODES: No lymphadenopathy. REPRODUCTIVE ORGANS: No acute abnormality. BONES AND SOFT TISSUES: No acute osseous abnormality. No focal soft tissue abnormality. IMPRESSION: 1. Mild right hydronephrosis and hydroureter secondary to an obstructing 4 mm calculus at the ureterovesicular junction 2. Additional 1 mm punctate nonobstructing calculus within the lower right kidney Electronically signed by: Dorethia Molt MD 10/09/2024 09:20 PM EST RP Workstation: [id number]     Procedures   Medications Ordered in the ED  ondansetron  (ZOFRAN ) injection 4 mg (4 mg Intravenous Given 10/09/24 1918)  fentaNYL  (SUBLIMAZE ) injection 50 mcg (50 mcg Intravenous Given 10/09/24 1918)  HYDROmorphone (DILAUDID) injection 1 mg (1 mg Intravenous  Given 10/09/24 1953)  ketorolac (TORADOL) 15 MG/ML injection 15 mg (15 mg Intravenous Given 10/09/24 2048)  cefTRIAXone (ROCEPHIN) 1 g in sodium chloride  0.9 % 100 mL IVPB (0 g Intravenous Stopped 10/09/24 2252)  HYDROcodone-acetaminophen  (NORCO/VICODIN) 5-325 MG per tablet 1 tablet (1 tablet Oral Given 10/09/24 2251)  tamsulosin (FLOMAX) capsule 0.4 mg (0.4 mg Oral Given 10/09/24 2251)                                    Medical Decision Making Amount and/or Complexity of Data Reviewed Labs: ordered. Radiology: ordered.  Risk OTC drugs. Prescription drug management.    28 year old female with medical history significant for nephrolithiasis presenting to the emergency department with acute onset right-sided flank pain with associated nausea and vomiting since this afternoon.  Denies any fevers, chills, denies any dysuria or hematuria.  Patient states that she has a history of kidney stones and symptoms feel similar.  She has been able to keep anything down, endorses 10 out of 10 pain in the right flank.  On arrival, the patient was afebrile, not tachycardic or tachypneic, hemodynamically stable, saturating well on room air.  Patient with CVA tenderness on exam.  Concern for nephrolithiasis.  Considered UTI/pyelonephritis or other acute intra-abdominal abnormality.  IV access was obtained and the patient was administered IV fentanyl  for pain control and IV Zofran  for nausea and vomiting.  Labs: CBC with a leukocytosis to 12.3, CMP without an AKI, creatinine 0.87, no significant electrolyte abnormality, lipase normal, hCG negative, urinalysis unconvincing for UTI with only rare bacteria present, no WBCs present.  CT stone: IMPRESSION:  1. Mild right hydronephrosis and hydroureter secondary to an obstructing 4 mm  calculus at the ureterovesicular junction  2. Additional 1 mm punctate nonobstructing calculus within the lower right  kidney    Patient was feeling symptomatically improved  following the above regimen of pain medicine.  She was administered initial Rocephin given her leukocytosis however I have less concern for infected stone at this time.  Discussed with on-call urology, spoke with Dr. Norva who was in agreement that the patient could be discharged with close follow-up with urology.  Will discharge the patient on Keflex, Flomax as well as Toradol and Norco for pain control.  Return precautions provided, patient overall tolerating p.o., feeling symptomatically improved at time of discharge.     Final diagnoses:  Ureterolithiasis    ED Discharge Orders          Ordered  Ambulatory referral to Urology        10/09/24 2251    cephALEXin (KEFLEX) 500 MG capsule  2 times daily        10/09/24 2252    tamsulosin (FLOMAX) 0.4 MG CAPS capsule  Daily        10/09/24 2252    ketorolac (TORADOL) 10 MG tablet  Every 6 hours PRN        10/09/24 2252    HYDROcodone-acetaminophen  (NORCO/VICODIN) 5-325 MG tablet  Every 6 hours PRN        10/09/24 2252               Jerrol Agent, MD 10/09/24 2255

## 2024-10-09 NOTE — ED Triage Notes (Signed)
 Reports R flank pain, N/V since this afternoon.  Denies dysuria, hematuria

## 2024-10-09 NOTE — ED Notes (Signed)
 D/c paperwork reviewed with pt, including prescriptions and follow up care.  All questions and/or concerns addressed at time of d/c.  No further needs expressed. . Pt verbalized understanding, Ambulatory with family to ED exit, NAD.
